# Patient Record
Sex: Female | Born: 1965 | Race: Black or African American | Hispanic: No | Smoking: Light tobacco smoker
Health system: Southern US, Community
[De-identification: ages and names within clinical notes are randomized; demographics above are authoritative.]

## PROBLEM LIST (undated history)

## (undated) DIAGNOSIS — E1140 Type 2 diabetes mellitus with diabetic neuropathy, unspecified: Secondary | ICD-10-CM

## (undated) DIAGNOSIS — R0600 Dyspnea, unspecified: Secondary | ICD-10-CM

## (undated) DIAGNOSIS — R011 Cardiac murmur, unspecified: Secondary | ICD-10-CM

## (undated) DIAGNOSIS — J449 Chronic obstructive pulmonary disease, unspecified: Secondary | ICD-10-CM

## (undated) DIAGNOSIS — E785 Hyperlipidemia, unspecified: Secondary | ICD-10-CM

## (undated) DIAGNOSIS — G4733 Obstructive sleep apnea (adult) (pediatric): Secondary | ICD-10-CM

## (undated) DIAGNOSIS — L03032 Cellulitis of left toe: Secondary | ICD-10-CM

## (undated) DIAGNOSIS — F419 Anxiety disorder, unspecified: Secondary | ICD-10-CM

## (undated) DIAGNOSIS — L2081 Atopic neurodermatitis: Secondary | ICD-10-CM

## (undated) DIAGNOSIS — I7410 Embolism and thrombosis of unspecified parts of aorta: Secondary | ICD-10-CM

## (undated) DIAGNOSIS — K3189 Other diseases of stomach and duodenum: Secondary | ICD-10-CM

## (undated) DIAGNOSIS — Z720 Tobacco use: Secondary | ICD-10-CM

## (undated) DIAGNOSIS — T8859XA Other complications of anesthesia, initial encounter: Secondary | ICD-10-CM

## (undated) DIAGNOSIS — L720 Epidermal cyst: Secondary | ICD-10-CM

## (undated) DIAGNOSIS — K219 Gastro-esophageal reflux disease without esophagitis: Secondary | ICD-10-CM

## (undated) DIAGNOSIS — I517 Cardiomegaly: Secondary | ICD-10-CM

## (undated) DIAGNOSIS — K259 Gastric ulcer, unspecified as acute or chronic, without hemorrhage or perforation: Secondary | ICD-10-CM

## (undated) DIAGNOSIS — E119 Type 2 diabetes mellitus without complications: Secondary | ICD-10-CM

## (undated) DIAGNOSIS — I10 Essential (primary) hypertension: Secondary | ICD-10-CM

## (undated) DIAGNOSIS — L709 Acne, unspecified: Secondary | ICD-10-CM

## (undated) DIAGNOSIS — I429 Cardiomyopathy, unspecified: Secondary | ICD-10-CM

## (undated) DIAGNOSIS — D649 Anemia, unspecified: Secondary | ICD-10-CM

## (undated) DIAGNOSIS — E669 Obesity, unspecified: Secondary | ICD-10-CM

## (undated) DIAGNOSIS — F341 Dysthymic disorder: Secondary | ICD-10-CM

## (undated) DIAGNOSIS — T4145XA Adverse effect of unspecified anesthetic, initial encounter: Secondary | ICD-10-CM

## (undated) HISTORY — PX: SHOULDER SURGERY: SHX246

## (undated) HISTORY — DX: Anemia, unspecified: D64.9

## (undated) HISTORY — DX: Hyperlipidemia, unspecified: E78.5

## (undated) HISTORY — DX: Tobacco use: Z72.0

## (undated) HISTORY — DX: Gastric ulcer, unspecified as acute or chronic, without hemorrhage or perforation: K25.9

## (undated) HISTORY — DX: Dysthymic disorder: F34.1

## (undated) HISTORY — DX: Chronic obstructive pulmonary disease, unspecified: J44.9

## (undated) HISTORY — DX: Obstructive sleep apnea (adult) (pediatric): G47.33

## (undated) HISTORY — PX: TUBAL LIGATION: SHX77

## (undated) HISTORY — DX: Other diseases of stomach and duodenum: K31.89

## (undated) HISTORY — DX: Atopic neurodermatitis: L20.81

## (undated) HISTORY — DX: Cardiomyopathy, unspecified: I42.9

## (undated) HISTORY — DX: Type 2 diabetes mellitus without complications: E11.9

## (undated) HISTORY — DX: Essential (primary) hypertension: I10

## (undated) HISTORY — DX: Cellulitis of left toe: L03.032

## (undated) HISTORY — PX: NOVASURE ABLATION: SHX5394

## (undated) HISTORY — PX: OTHER SURGICAL HISTORY: SHX169

## (undated) HISTORY — DX: Epidermal cyst: L72.0

## (undated) HISTORY — DX: Cardiomegaly: I51.7

## (undated) HISTORY — DX: Anxiety disorder, unspecified: F41.9

## (undated) HISTORY — DX: Gastro-esophageal reflux disease without esophagitis: K21.9

## (undated) HISTORY — DX: Embolism and thrombosis of unspecified parts of aorta: I74.10

## (undated) HISTORY — DX: Obesity, unspecified: E66.9

## (undated) HISTORY — DX: Acne, unspecified: L70.9

---

## 2015-11-08 DIAGNOSIS — G4733 Obstructive sleep apnea (adult) (pediatric): Secondary | ICD-10-CM | POA: Diagnosis not present

## 2015-12-02 DIAGNOSIS — Z6837 Body mass index (BMI) 37.0-37.9, adult: Secondary | ICD-10-CM | POA: Diagnosis not present

## 2015-12-02 DIAGNOSIS — Z4802 Encounter for removal of sutures: Secondary | ICD-10-CM | POA: Diagnosis not present

## 2015-12-02 DIAGNOSIS — R300 Dysuria: Secondary | ICD-10-CM | POA: Diagnosis not present

## 2015-12-09 DIAGNOSIS — G4733 Obstructive sleep apnea (adult) (pediatric): Secondary | ICD-10-CM | POA: Diagnosis not present

## 2016-01-08 DIAGNOSIS — G4733 Obstructive sleep apnea (adult) (pediatric): Secondary | ICD-10-CM | POA: Diagnosis not present

## 2016-02-08 DIAGNOSIS — G4733 Obstructive sleep apnea (adult) (pediatric): Secondary | ICD-10-CM | POA: Diagnosis not present

## 2016-03-05 DIAGNOSIS — F341 Dysthymic disorder: Secondary | ICD-10-CM | POA: Diagnosis not present

## 2016-03-05 DIAGNOSIS — E119 Type 2 diabetes mellitus without complications: Secondary | ICD-10-CM | POA: Diagnosis not present

## 2016-03-05 DIAGNOSIS — K219 Gastro-esophageal reflux disease without esophagitis: Secondary | ICD-10-CM | POA: Diagnosis not present

## 2016-03-05 DIAGNOSIS — G4733 Obstructive sleep apnea (adult) (pediatric): Secondary | ICD-10-CM | POA: Diagnosis not present

## 2016-03-06 DIAGNOSIS — E119 Type 2 diabetes mellitus without complications: Secondary | ICD-10-CM | POA: Diagnosis not present

## 2016-03-06 DIAGNOSIS — Z13220 Encounter for screening for lipoid disorders: Secondary | ICD-10-CM | POA: Diagnosis not present

## 2016-03-06 DIAGNOSIS — I10 Essential (primary) hypertension: Secondary | ICD-10-CM | POA: Diagnosis not present

## 2016-03-06 DIAGNOSIS — R6889 Other general symptoms and signs: Secondary | ICD-10-CM | POA: Diagnosis not present

## 2016-03-06 DIAGNOSIS — R945 Abnormal results of liver function studies: Secondary | ICD-10-CM | POA: Diagnosis not present

## 2016-03-06 DIAGNOSIS — E785 Hyperlipidemia, unspecified: Secondary | ICD-10-CM | POA: Diagnosis not present

## 2016-03-09 DIAGNOSIS — G4733 Obstructive sleep apnea (adult) (pediatric): Secondary | ICD-10-CM | POA: Diagnosis not present

## 2016-03-31 ENCOUNTER — Ambulatory Visit: Payer: BLUE CROSS/BLUE SHIELD | Admitting: Cardiology

## 2016-04-08 ENCOUNTER — Encounter: Payer: Self-pay | Admitting: Cardiology

## 2016-04-09 DIAGNOSIS — G4733 Obstructive sleep apnea (adult) (pediatric): Secondary | ICD-10-CM | POA: Diagnosis not present

## 2016-04-16 DIAGNOSIS — M25521 Pain in right elbow: Secondary | ICD-10-CM | POA: Diagnosis not present

## 2016-04-16 DIAGNOSIS — T148 Other injury of unspecified body region: Secondary | ICD-10-CM | POA: Diagnosis not present

## 2016-04-16 DIAGNOSIS — R51 Headache: Secondary | ICD-10-CM | POA: Diagnosis not present

## 2016-04-16 DIAGNOSIS — M79601 Pain in right arm: Secondary | ICD-10-CM | POA: Diagnosis not present

## 2016-04-16 DIAGNOSIS — R0789 Other chest pain: Secondary | ICD-10-CM | POA: Diagnosis not present

## 2016-04-16 DIAGNOSIS — M542 Cervicalgia: Secondary | ICD-10-CM | POA: Diagnosis not present

## 2016-04-22 DIAGNOSIS — S134XXA Sprain of ligaments of cervical spine, initial encounter: Secondary | ICD-10-CM | POA: Diagnosis not present

## 2016-04-22 DIAGNOSIS — M542 Cervicalgia: Secondary | ICD-10-CM | POA: Diagnosis not present

## 2016-04-22 DIAGNOSIS — S5001XA Contusion of right elbow, initial encounter: Secondary | ICD-10-CM | POA: Diagnosis not present

## 2016-04-23 ENCOUNTER — Encounter (INDEPENDENT_AMBULATORY_CARE_PROVIDER_SITE_OTHER): Payer: Self-pay

## 2016-04-23 ENCOUNTER — Encounter: Payer: Self-pay | Admitting: Cardiology

## 2016-04-23 ENCOUNTER — Ambulatory Visit (INDEPENDENT_AMBULATORY_CARE_PROVIDER_SITE_OTHER): Payer: BLUE CROSS/BLUE SHIELD | Admitting: Cardiology

## 2016-04-23 VITALS — BP 192/102 | HR 95 | Ht 65.5 in | Wt 228.0 lb

## 2016-04-23 DIAGNOSIS — I10 Essential (primary) hypertension: Secondary | ICD-10-CM | POA: Diagnosis not present

## 2016-04-23 DIAGNOSIS — I429 Cardiomyopathy, unspecified: Secondary | ICD-10-CM

## 2016-04-23 DIAGNOSIS — Z720 Tobacco use: Secondary | ICD-10-CM | POA: Diagnosis not present

## 2016-04-23 DIAGNOSIS — I110 Hypertensive heart disease with heart failure: Secondary | ICD-10-CM | POA: Insufficient documentation

## 2016-04-23 DIAGNOSIS — E669 Obesity, unspecified: Secondary | ICD-10-CM | POA: Diagnosis not present

## 2016-04-23 HISTORY — DX: Obesity, unspecified: E66.9

## 2016-04-23 HISTORY — DX: Cardiomyopathy, unspecified: I42.9

## 2016-04-23 HISTORY — DX: Tobacco use: Z72.0

## 2016-04-23 HISTORY — DX: Essential (primary) hypertension: I10
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
## 2016-04-23 NOTE — Patient Instructions (Signed)
Medication Instructions:  Please take medications as listed.  Testing/Procedures: Your physician has requested that you have an echocardiogram. Echocardiography is a painless test that uses sound waves to create images of your heart. It provides your doctor with information about the size and shape of your heart and how well your heart's chambers and valves are working. This procedure takes approximately one hour. There are no restrictions for this procedure.  Follow-Up: Follow up will be based on the results of the above testing.  Thank you for choosing [facility]!!   '

## 2016-05-07 ENCOUNTER — Ambulatory Visit (HOSPITAL_COMMUNITY): Payer: BLUE CROSS/BLUE SHIELD | Attending: Cardiology

## 2016-05-07 ENCOUNTER — Other Ambulatory Visit: Payer: Self-pay

## 2016-05-07 DIAGNOSIS — E785 Hyperlipidemia, unspecified: Secondary | ICD-10-CM | POA: Diagnosis not present

## 2016-05-07 DIAGNOSIS — I429 Cardiomyopathy, unspecified: Secondary | ICD-10-CM

## 2016-05-07 DIAGNOSIS — I351 Nonrheumatic aortic (valve) insufficiency: Secondary | ICD-10-CM | POA: Insufficient documentation

## 2016-05-07 DIAGNOSIS — E119 Type 2 diabetes mellitus without complications: Secondary | ICD-10-CM | POA: Diagnosis not present

## 2016-05-07 DIAGNOSIS — G4733 Obstructive sleep apnea (adult) (pediatric): Secondary | ICD-10-CM | POA: Insufficient documentation

## 2016-05-07 DIAGNOSIS — J449 Chronic obstructive pulmonary disease, unspecified: Secondary | ICD-10-CM | POA: Diagnosis not present

## 2016-05-07 DIAGNOSIS — I10 Essential (primary) hypertension: Secondary | ICD-10-CM

## 2016-05-07 DIAGNOSIS — I119 Hypertensive heart disease without heart failure: Secondary | ICD-10-CM | POA: Insufficient documentation

## 2016-05-07 DIAGNOSIS — I340 Nonrheumatic mitral (valve) insufficiency: Secondary | ICD-10-CM | POA: Insufficient documentation

## 2016-05-10 DIAGNOSIS — G4733 Obstructive sleep apnea (adult) (pediatric): Secondary | ICD-10-CM | POA: Diagnosis not present

## 2016-05-14 ENCOUNTER — Encounter: Payer: Self-pay | Admitting: *Deleted

## 2016-07-13 ENCOUNTER — Telehealth: Payer: Self-pay | Admitting: Cardiology

## 2016-07-13 NOTE — Telephone Encounter (Signed)
Reviewed the below information with pt who states understanding.  She will f/u with her PCP.  Notes Recorded by Jake BatheMark C Skains, MD on 05/11/2016 at 6:17 AM EDT EF normal 60%.  Mild to moderate aortic regurgitation.  Moderate LVH  Continue with BP control.  No signs of cardiomyopathy.  Reassuring  With mild to moderate aortic regurgitation, consider repeat ECHO in 3 -5 years.  No need at this point for cardiology follow up. Continue care under Dr. Marina GoodellPerry (PCP)  Donato SchultzMark Skains, MD

## 2016-07-13 NOTE — Telephone Encounter (Signed)
New message    Pt verbalized that she is calling for Echo results

## 2016-07-17 DIAGNOSIS — I10 Essential (primary) hypertension: Secondary | ICD-10-CM | POA: Diagnosis not present

## 2016-07-17 DIAGNOSIS — F341 Dysthymic disorder: Secondary | ICD-10-CM | POA: Diagnosis not present

## 2016-07-17 DIAGNOSIS — G4733 Obstructive sleep apnea (adult) (pediatric): Secondary | ICD-10-CM | POA: Diagnosis not present

## 2016-07-17 DIAGNOSIS — E119 Type 2 diabetes mellitus without complications: Secondary | ICD-10-CM | POA: Diagnosis not present

## 2016-07-21 DIAGNOSIS — M7989 Other specified soft tissue disorders: Secondary | ICD-10-CM | POA: Diagnosis not present

## 2016-07-22 DIAGNOSIS — M7061 Trochanteric bursitis, right hip: Secondary | ICD-10-CM | POA: Diagnosis not present

## 2016-11-03 DIAGNOSIS — I10 Essential (primary) hypertension: Secondary | ICD-10-CM | POA: Diagnosis not present

## 2016-11-03 DIAGNOSIS — E1142 Type 2 diabetes mellitus with diabetic polyneuropathy: Secondary | ICD-10-CM | POA: Diagnosis not present

## 2016-11-03 DIAGNOSIS — E782 Mixed hyperlipidemia: Secondary | ICD-10-CM | POA: Diagnosis not present

## 2016-11-05 DIAGNOSIS — I10 Essential (primary) hypertension: Secondary | ICD-10-CM | POA: Diagnosis not present

## 2016-11-05 DIAGNOSIS — E782 Mixed hyperlipidemia: Secondary | ICD-10-CM | POA: Diagnosis not present

## 2016-11-05 DIAGNOSIS — E1142 Type 2 diabetes mellitus with diabetic polyneuropathy: Secondary | ICD-10-CM | POA: Diagnosis not present

## 2016-11-06 DIAGNOSIS — I70213 Atherosclerosis of native arteries of extremities with intermittent claudication, bilateral legs: Secondary | ICD-10-CM | POA: Diagnosis not present

## 2016-11-06 DIAGNOSIS — M79605 Pain in left leg: Secondary | ICD-10-CM | POA: Diagnosis not present

## 2016-11-06 DIAGNOSIS — M79604 Pain in right leg: Secondary | ICD-10-CM | POA: Diagnosis not present

## 2016-11-24 ENCOUNTER — Other Ambulatory Visit: Payer: Self-pay | Admitting: *Deleted

## 2016-11-24 DIAGNOSIS — I739 Peripheral vascular disease, unspecified: Secondary | ICD-10-CM

## 2016-12-10 ENCOUNTER — Encounter: Payer: Self-pay | Admitting: Vascular Surgery

## 2016-12-21 DIAGNOSIS — Z6837 Body mass index (BMI) 37.0-37.9, adult: Secondary | ICD-10-CM | POA: Diagnosis not present

## 2016-12-21 DIAGNOSIS — R875 Abnormal microbiological findings in specimens from female genital organs: Secondary | ICD-10-CM | POA: Diagnosis not present

## 2016-12-21 DIAGNOSIS — Z0000 Encounter for general adult medical examination without abnormal findings: Secondary | ICD-10-CM | POA: Diagnosis not present

## 2016-12-21 DIAGNOSIS — Z1231 Encounter for screening mammogram for malignant neoplasm of breast: Secondary | ICD-10-CM | POA: Diagnosis not present

## 2016-12-21 DIAGNOSIS — Z01419 Encounter for gynecological examination (general) (routine) without abnormal findings: Secondary | ICD-10-CM | POA: Diagnosis not present

## 2016-12-22 ENCOUNTER — Encounter: Payer: Self-pay | Admitting: Vascular Surgery

## 2016-12-22 ENCOUNTER — Ambulatory Visit (HOSPITAL_COMMUNITY)
Admission: RE | Admit: 2016-12-22 | Discharge: 2016-12-22 | Disposition: A | Discharge status: Home / Self Care | Payer: BLUE CROSS/BLUE SHIELD | Source: Ambulatory Visit | Attending: Vascular Surgery | Admitting: Vascular Surgery

## 2016-12-22 ENCOUNTER — Telehealth: Payer: Self-pay | Admitting: Vascular Surgery

## 2016-12-22 ENCOUNTER — Ambulatory Visit (INDEPENDENT_AMBULATORY_CARE_PROVIDER_SITE_OTHER): Payer: BLUE CROSS/BLUE SHIELD | Admitting: Vascular Surgery

## 2016-12-22 VITALS — BP 142/78 | HR 76 | Temp 97.7°F | Resp 16 | Ht 65.5 in | Wt 227.0 lb

## 2016-12-22 DIAGNOSIS — I739 Peripheral vascular disease, unspecified: Secondary | ICD-10-CM | POA: Diagnosis not present

## 2016-12-22 NOTE — Telephone Encounter (Signed)
Per Dr.Early's instructions on the discharge sheet from today's visit I scheduled a CTA abd/pelvis w/ runoff at [facility] on [facility] 01/06/17. The patient will need to arrive at 12 noon at the Outpatient Center located on the corner of [facility] for bloodwork prior to the CTA at 1pm.  No solid foods 4 hours prior. Liquids and medications are okay. She is scheduled to see Dr.Early here at [facility] on 01/19/17 at 2:30pm. I left a detailed voice message for the patient regarding these appointments and I also mailed a letter as well. awt

## 2016-12-22 NOTE — Progress Notes (Signed)
[facility]  Patient name: Lori Carter MRN: [id number] DOB: 1966-04-15 Sex: female  REASON FOR CONSULT: Lower extremity arterial insufficiency with limiting claudication  HPI: Lori Carter is a 51 y.o. female, who is seen today for evaluation of lower external ureter insufficiency. She works as a Clinical biochemist and has had progressive changes over the last several months of inability to walk any distance without pain. She reports that this is a 18 cramping sensation that begins in her calves and can extend into her thighs. She reports makes it difficult for her to work having to stop multiple times. She denies any tissue loss with specifically no ulcerations on her feet. She does have a history in the past approximately 10 years ago with a 7 day admission for possible cardiac issues but does never been told specifically that she had a myocardial infarction. He does have a family history of cardiac and peripheral vascular occlusive disease. Does have a long history of cigarette smoking.  Past Medical History:  Diagnosis Date  . Acne   . Anxiety   . Atopic neurodermatitis   . Cardiomegaly   . Cellulitis of toe of left foot    acute  . COPD (chronic obstructive pulmonary disease) (HCC)   . Dysthymic disorder   . Epidermal inclusion cyst   . GERD (gastroesophageal reflux disease)   . Hyperlipidemia   . Hypertension, benign    chronic  . Obstructive sleep apnea   . Type 2 diabetes mellitus without complication (HCC)    chronic    Family History  Problem Relation Age of Onset  . Hypertension Mother   . Clotting disorder Mother   . Heart murmur Father   . Pancreatic cancer Father   . Diabetes Father   . Hypertension Father     SOCIAL HISTORY: Social History   Social History  . Marital status: Divorced    Spouse name: N/A  . Number of children: N/A  . Years of education: N/A   Occupational History  . CNA     Social History Main Topics  . Smoking status: Light Tobacco Smoker  . Smokeless tobacco: Never Used     Comment: 1 pk lasts 4 days.  . Alcohol use No  . Drug use: No  . Sexual activity: Not on file   Other Topics Concern  . Not on file   Social History Narrative  . No narrative on file    No Known Allergies  Current Outpatient Prescriptions  Medication Sig Dispense Refill  . albuterol (PROVENTIL HFA;VENTOLIN HFA) 108 (90 Base) MCG/ACT inhaler Inhale 2 puffs into the lungs every 6 (six) hours as needed for wheezing or shortness of breath.    Marland Kitchen aspirin 325 MG EC tablet Take 325 mg by mouth daily.    Marland Kitchen atorvastatin (LIPITOR) 10 MG tablet Take 10 mg by mouth daily.    . carvedilol (COREG) 25 MG tablet Take 25 mg by mouth 2 (two) times daily with a meal.    . citalopram (CELEXA) 20 MG tablet Take 20 mg by mouth daily.    . cyclobenzaprine (FLEXERIL) 10 MG tablet Take 5 mg by mouth daily as needed for muscle spasms.    . Emollient (LUBRIDERM) LOTN Apply 1 application topically daily.    Marland Kitchen glucose blood (FREESTYLE LITE) test strip 1 each by Other route as needed for other. Use as instructed    . hydrochlorothiazide (HYDRODIURIL) 25 MG tablet Take 12.5 mg by mouth 2 (  two) times daily.    Marland Kitchen HYDROcodone-acetaminophen (NORCO/VICODIN) 5-325 MG tablet Take 5-325 tablets by mouth every 4 (four) hours as needed for pain.  0  . Lancets (FREESTYLE) lancets 1 each by Other route as needed for other. Use as instructed    . Loratadine (CLARITIN) 10 MG CAPS Take 10 mg by mouth daily as needed (allergies).    . meloxicam (MOBIC) 7.5 MG tablet Take 7.5 mg by mouth daily as needed for pain.  0  . metFORMIN (GLUCOPHAGE) 500 MG tablet Take 500 mg by mouth 2 (two) times daily with a meal.    . Olmesartan-Amlodipine-HCTZ (TRIBENZOR) 40-10-25 MG TABS Take 1 tablet by mouth daily.     No current facility-administered medications for this visit.     REVIEW OF SYSTEMS:   denotes positive finding,   denotes negative finding Cardiac  Comments:  Chest pain or chest pressure: x   Shortness of breath upon exertion: x   Short of breath when lying flat:    Irregular heart rhythm:        Vascular    Pain in calf, thigh, or hip brought on by ambulation: x   Pain in feet at night that wakes you up from your sleep:  x   Blood clot in your veins:    Leg swelling:  x       Pulmonary    Oxygen at home:    Productive cough:  x   Wheezing:         Neurologic    Sudden weakness in arms or legs:  x   Sudden numbness in arms or legs:  x   Sudden onset of difficulty speaking or slurred speech:    Temporary loss of vision in one eye:     Problems with dizziness:         Gastrointestinal    Blood in stool:     Vomited blood:         Genitourinary    Burning when urinating:     Blood in urine:        Psychiatric    Major depression:         Hematologic    Bleeding problems:    Problems with blood clotting too easily:        Skin    Rashes or ulcers:        Constitutional    Fever or chills:      PHYSICAL EXAM: Vitals:   12/22/16 1117  BP: (!) 142/78  Pulse: 76  Resp: 16  Temp: 97.7 F (36.5 C)  TempSrc: Oral  SpO2: 94%  Weight: 227 lb (103 kg)  Height: 5' 5.5" (1.664 m)    GENERAL: The patient is a well-nourished female, in no acute distress. The vital signs are documented above. CARDIOVASCULAR: 2+ radial pulses bilaterally. Absent popliteal and distal pulses bilaterally. Carotid arteries without bruits. PULMONARY: There is good air exchange  ABDOMEN: Soft and non-tender  MUSCULOSKELETAL: There are no major deformities or cyanosis. NEUROLOGIC: No focal weakness or paresthesias are detected. SKIN: There are no ulcers or rashes noted. PSYCHIATRIC: The patient has a normal affect.  DATA:  Noninvasive studies at [facility] suggested ankle arm index of 0.4 bilaterally. 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 No rest pain or tissue loss. 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 Have recommended CT  angiogram for further evaluation. Explained that she may be a candidate for endovascular treatment or may require open bypass surgery depending on the severity of her disease. She will obtain CT angiogram as an outpatient had [facility] and will see me back for further discussion following this.   Larina Earthly, MD FACS [facility] Tel [phone] Pager [phone]

## 2016-12-24 ENCOUNTER — Encounter: Payer: Self-pay | Admitting: Legal Medicine

## 2016-12-24 NOTE — Addendum Note (Signed)
Addended by: Burton Apley A on: 12/24/2016 09:09 AM   Modules accepted: Orders

## 2017-01-06 DIAGNOSIS — I745 Embolism and thrombosis of iliac artery: Secondary | ICD-10-CM | POA: Diagnosis not present

## 2017-01-06 DIAGNOSIS — K760 Fatty (change of) liver, not elsewhere classified: Secondary | ICD-10-CM | POA: Diagnosis not present

## 2017-01-06 DIAGNOSIS — I700 Atherosclerosis of aorta: Secondary | ICD-10-CM | POA: Diagnosis not present

## 2017-01-06 DIAGNOSIS — I739 Peripheral vascular disease, unspecified: Secondary | ICD-10-CM | POA: Diagnosis not present

## 2017-01-12 ENCOUNTER — Encounter: Payer: Self-pay | Admitting: Vascular Surgery

## 2017-01-19 ENCOUNTER — Ambulatory Visit (INDEPENDENT_AMBULATORY_CARE_PROVIDER_SITE_OTHER): Payer: BLUE CROSS/BLUE SHIELD | Admitting: Vascular Surgery

## 2017-01-19 ENCOUNTER — Other Ambulatory Visit: Payer: Self-pay | Admitting: Physician Assistant

## 2017-01-19 ENCOUNTER — Encounter: Payer: Self-pay | Admitting: Vascular Surgery

## 2017-01-19 VITALS — BP 154/83 | HR 85 | Temp 98.9°F | Resp 20 | Ht 65.5 in | Wt 233.0 lb

## 2017-01-19 DIAGNOSIS — I739 Peripheral vascular disease, unspecified: Secondary | ICD-10-CM | POA: Diagnosis not present

## 2017-01-19 MED ORDER — VARENICLINE TARTRATE 0.5 MG X 11 & 1 MG X 42 PO MISC: ORAL | 0 refills | Status: DC

## 2017-01-19 NOTE — Progress Notes (Signed)
Cardiology Office Note   Date:  01/20/2017   ID:  Lori, Carter 30-Oct-1965, MRN [id number]  PCP:  Abigail Miyamoto, MD  Cardiologist:  Dr. Anne Fu     Chief Complaint  Patient presents with  . Pre-op Exam      History of Present Illness: Lori Carter is a 51 y.o. female who presents for pre-op clearance at request of Dr. Arbie Cookey.  She has claudication.  Dr. Arbie Cookey has recommended  aortobifemoral bypass grafting. He explained that she could potentially be a candidate for left femoral endarterectomy in femorofemoral bypass.  She has a hx of cardiomyopathy -echo in 2017 with EF 60%, mild to mod aortic regurg. Moderate LVH. Also hx of HTN, obesity, tobacco use.    Today she is doing well, no chest pain but cannot walk up a flight of steps due to DOE, she has to stop and wait for improved breathing.  When she plays with her grandkids she is SOB.  She has a remote treadmill over 3 years ago but thinks it was OK.   We reviewed her echo from last year.   She occ has lower ext edema but resolves over night.    She takes ASA 2 81 mg tabs, she is not sure why she is on higher dose,  She will check with Dr. Arbie Cookey post procedure to see if 81 mg is enough.    She continues to smoke and has been given chantix pak, the nicoderm patches did not help.    Past Medical History:  Diagnosis Date  . Acne   . Anxiety   . Atopic neurodermatitis   . Cardiomegaly   . Cellulitis of toe of left foot    acute  . COPD (chronic obstructive pulmonary disease) (HCC)   . Dysthymic disorder   . Epidermal inclusion cyst   . GERD (gastroesophageal reflux disease)   . Hyperlipidemia   . Hypertension, benign    chronic  . Obstructive sleep apnea   . Type 2 diabetes mellitus without complication (HCC)    chronic    Past Surgical History:  Procedure Laterality Date  . none       Current Outpatient Prescriptions  Medication Sig Dispense Refill  . albuterol (PROVENTIL HFA;VENTOLIN HFA) 108 (90  Base) MCG/ACT inhaler Inhale 2 puffs into the lungs every 6 (six) hours as needed for wheezing or shortness of breath.    Marland Kitchen aspirin EC 81 MG tablet Take 162 mg by mouth daily.    Marland Kitchen atorvastatin (LIPITOR) 10 MG tablet Take 10 mg by mouth daily.    . carvedilol (COREG) 25 MG tablet Take 25 mg by mouth 2 (two) times daily with a meal.    . citalopram (CELEXA) 20 MG tablet Take 20 mg by mouth daily.    . cyclobenzaprine (FLEXERIL) 10 MG tablet Take 5 mg by mouth daily as needed for muscle spasms.    . Emollient (LUBRIDERM) LOTN Apply 1 application topically daily.    Marland Kitchen gabapentin (NEURONTIN) 300 MG capsule Take 300 mg by mouth 3 (three) times daily.    Marland Kitchen glucose blood (FREESTYLE LITE) test strip 1 each by Other route as needed for other. Use as instructed    . hydrochlorothiazide (HYDRODIURIL) 25 MG tablet Take 12.5 mg by mouth 2 (two) times daily.    Marland Kitchen HYDROcodone-acetaminophen (NORCO/VICODIN) 5-325 MG tablet Take 5-325 tablets by mouth every 4 (four) hours as needed for pain.  0  . Lancets (FREESTYLE) lancets 1 each by  Other route as needed for other. Use as instructed    . Loratadine (CLARITIN) 10 MG CAPS Take 10 mg by mouth daily as needed (allergies).    . meloxicam (MOBIC) 7.5 MG tablet Take 7.5 mg by mouth daily as needed for pain.  0  . metFORMIN (GLUCOPHAGE) 500 MG tablet Take 500 mg by mouth 2 (two) times daily with a meal.    . Olmesartan-Amlodipine-HCTZ (TRIBENZOR) 40-10-25 MG TABS Take 1 tablet by mouth daily.    . varenicline (CHANTIX STARTING MONTH PAK) 0.5 MG X 11 & 1 MG X 42 tablet Take one 0.5 mg tablet by mouth once daily for 3 days, then increase to one 0.5 mg tablet twice daily for 4 days, then increase to one 1 mg tablet twice daily. 53 tablet 0   No current facility-administered medications for this visit.     Allergies:   Patient has no known allergies.    Social History:  The patient  reports that she has been smoking.  She has never used smokeless tobacco. She reports  that she does not drink alcohol or use drugs.   Family History:  The patient's family history includes Clotting disorder in her mother; Diabetes in her father; Heart murmur in her father; Hypertension in her father and mother; Pancreatic cancer in her father.    ROS:  General:no colds or fevers, no weight changes Skin:no rashes or ulcers HEENT:no blurred vision, no congestion CV:see HPI PUL:see HPI GI:no diarrhea constipation or melena, no indigestion GU:no hematuria, no dysuria MS:no joint pain, + claudication Neuro:no syncope, no lightheadedness Endo:+ diabetes with glucose 120-130.  no thyroid disease  Wt Readings from Last 3 Encounters:  01/20/17 231 lb (104.8 kg)  01/19/17 233 lb (105.7 kg)  12/22/16 227 lb (103 kg)     PHYSICAL EXAM: VS:  BP 132/74   Pulse 83   Ht 5' 5.5" (1.664 m)   Wt 231 lb (104.8 kg)   LMP  (LMP Unknown)   BMI 37.86 kg/m  , BMI Body mass index is 37.86 kg/m. General:Pleasant affect, NAD Skin:Warm and dry, brisk capillary refill HEENT:normocephalic, sclera clear, mucus membranes moist Neck:supple, no JVD, no bruits  Heart:S1S2 RRR with 2/6 systolic murmur, no gallup, rub or click Lungs:clear without rales, rhonchi, or wheezes ZOX:WRUEAbd:soft, non tender, + BS, do not palpate liver spleen or masses Ext:tr lower ext edema, 2+ radial pulses Neuro:alert and oriented X 3, MAE, follows commands, + facial symmetry    EKG:  EKG is ordered today. The ekg ordered today demonstrates SR, T wave abnormality in inf lat leads similar to EKG 04/23/17.     Recent Labs: No results found for requested labs within last 8760 hours.    Lipid Panel No results found for: CHOL, TRIG, HDL, CHOLHDL, VLDL, LDLCALC, LDLDIRECT     Other studies Reviewed: Additional studies/ records that were reviewed today include:   ECHO 05/2016 . Study Conclusions  - Left ventricle: The cavity size was normal. Wall thickness was   increased in a pattern of moderate LVH. Systolic  function was   normal. The estimated ejection fraction was in the range of 55%   to 60%. Wall motion was normal; there were no regional wall   motion abnormalities. Doppler parameters are consistent with   abnormal left ventricular relaxation (grade 1 diastolic   dysfunction). - Aortic valve: There was no stenosis. There was mild to moderate   regurgitation. - Mitral valve: There was mild regurgitation. - Left atrium: The  atrium was mildly dilated. - Right ventricle: The cavity size was normal. Systolic function   was normal. - Pulmonary arteries: No complete TR doppler jet so unable to   estimate PA systolic pressure. - Inferior vena cava: The vessel was normal in size. The   respirophasic diameter changes were in the normal range (>= 50%),   consistent with normal central venous pressure.  Impressions:  - Normal LV size with moderate LV hypertrophy. EF 55-60%. Mild to   moderate aortic insufficiency. Normal RV size and systolic   function. Mild mitral regurgitation.   ASSESSMENT AND PLAN:  1.  Pre-op exam  Pt cannot walk up one flight of streps without stopping to catch breath, no chest pain  But she is diabetic. Will do exercise myoview to eval for ischemia and to rate for high risk surgery.  If negative will clear ans she will follow up with Dr. Anne Fu in 6 months if + will review with Dr. Anne Fu and possible cath.  2. Hx cardiomyopathy with normal EF on last Echo in 9/2-17  3.  Mild to mod AR and moderate LVH  4. DM-2 per PCP  5. HTN stable today   6. Tobacco use is plannign to stop, she is aware of the importance.    Current medicines are reviewed with the patient today.  The patient Has no concerns regarding medicines.  The following changes have been made:  See above Labs/ tests ordered today include:see above  Disposition:   FU:  see above  Signed, Nada Boozer, NP  01/20/2017 11:01 AM    [facility] [address] [place], [place],  [place]  [address]/ [address] [place], [place] Phone: [phone]; Fax: [phone]  [phone]

## 2017-01-19 NOTE — Progress Notes (Signed)
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 female who was seen for evaluation of lower external ureter insufficiency by Dr. Arbie CookeyEarly on 12/22/16. She works as a Clinical biochemistCMA and has had progressive changes over the last several months of inability to walk any distance without pain. She reports that this is a 18 cramping sensation that begins in her calves and can extend into her thighs. She reports makes it difficult for her to work having to stop multiple times. She denies any tissue loss with specifically no ulcerations on her feet. She does have a history in the past approximately 10 years ago with a 7 day admission for possible cardiac issues but has never been told specifically that she had a myocardial infarction. She does have a family history of cardiac and peripheral vascular occlusive disease. Does have a long history of cigarette smoking.  She had ABI's of 0.4 bilaterally.  Dr. Arbie CookeyEarly recommended CTA and return for results.  She is here today to discuss the results.  She does continue to have claudication with walking short distances. She does not have pain at rest.  She does have some cramping at night on the top of her foot.  She does not have non healing wounds.    She is on a statin for cholesterol management.  She is a beta blocker, ARB,  CCB and HCTZ for blood pressure management. She is on Metformin for her DM.  She takes a daily aspirin.   Past Medical History:  Diagnosis Date  . Acne   . Anxiety   . Atopic neurodermatitis   . Cardiomegaly   . Cellulitis of toe of left foot    acute  . COPD (chronic obstructive pulmonary disease) (HCC)   . Dysthymic disorder   . Epidermal inclusion cyst   . GERD (gastroesophageal reflux disease)   . Hyperlipidemia   . Hypertension, benign    chronic  . Obstructive sleep apnea   . Type 2 diabetes mellitus without complication  (HCC)    chronic    Past Surgical History:  Procedure Laterality Date  . none      No Known Allergies  Current Outpatient Prescriptions  Medication Sig Dispense Refill  . albuterol (PROVENTIL HFA;VENTOLIN HFA) 108 (90 Base) MCG/ACT inhaler Inhale 2 puffs into the lungs every 6 (six) hours as needed for wheezing or shortness of breath.    Marland Kitchen. aspirin 325 MG EC tablet Take 325 mg by mouth daily.    Marland Kitchen. atorvastatin (LIPITOR) 10 MG tablet Take 10 mg by mouth daily.    . carvedilol (COREG) 25 MG tablet Take 25 mg by mouth 2 (two) times daily with a meal.    . citalopram (CELEXA) 20 MG tablet Take 20 mg by mouth daily.    . cyclobenzaprine (FLEXERIL) 10 MG tablet Take 5 mg by mouth daily as needed for muscle spasms.    . Emollient (LUBRIDERM) LOTN Apply 1 application topically daily.    Marland Kitchen. glucose blood (FREESTYLE LITE) test strip 1 each by Other route as needed for other. Use as instructed    . hydrochlorothiazide (HYDRODIURIL) 25 MG tablet Take 12.5 mg by mouth 2 (two) times daily.    Marland Kitchen. HYDROcodone-acetaminophen (NORCO/VICODIN) 5-325 MG tablet Take 5-325 tablets by mouth every 4 (four) hours as needed for pain.  0  . Lancets (FREESTYLE) lancets 1 each by Other route as  needed for other. Use as instructed    . Loratadine (CLARITIN) 10 MG CAPS Take 10 mg by mouth daily as needed (allergies).    . meloxicam (MOBIC) 7.5 MG tablet Take 7.5 mg by mouth daily as needed for pain.  0  . metFORMIN (GLUCOPHAGE) 500 MG tablet Take 500 mg by mouth 2 (two) times daily with a meal.    . Olmesartan-Amlodipine-HCTZ (TRIBENZOR) 40-10-25 MG TABS Take 1 tablet by mouth daily.    . varenicline (CHANTIX STARTING MONTH PAK) 0.5 MG X 11 & 1 MG X 42 tablet Take one 0.5 mg tablet by mouth once daily for 3 days, then increase to one 0.5 mg tablet twice daily for 4 days, then increase to one 1 mg tablet twice daily. 53 tablet 0   No current facility-administered medications for this visit.     Family History  Problem  Relation Age of Onset  . Hypertension Mother   . Clotting disorder Mother   . Heart murmur Father   . Pancreatic cancer Father   . Diabetes Father   . Hypertension Father     Social History   Social History  . Marital status: Divorced    Spouse name: N/A  . Number of children: N/A  . Years of education: N/A   Occupational History  . CNA    Social History Main Topics  . Smoking status: Light Tobacco Smoker  . Smokeless tobacco: Never Used     Comment: 1 pk lasts 4 days.  . Alcohol use No  . Drug use: No  . Sexual activity: Not on file   Other Topics Concern  . Not on file   Social History Narrative  . No narrative on file     REVIEW OF SYSTEMS:   [X]  denotes positive finding, [ ]  denotes negative finding Cardiac  Comments:  Chest pain or chest pressure:    Shortness of breath upon exertion: x   Short of breath when lying flat: x   Irregular heart rhythm: x       Vascular    Pain in calf, thigh, or hip brought on by ambulation: x   Pain in feet at night that wakes you up from your sleep:  x   Blood clot in your veins:    Leg swelling:  x       Pulmonary    Oxygen at home:    Productive cough:  x   Wheezing:         Neurologic    Sudden weakness in arms or legs:  x Legs  Sudden numbness in arms or legs:  x Legs  Sudden onset of difficulty speaking or slurred speech:    Temporary loss of vision in one eye:     Problems with dizziness:         Gastrointestinal    Blood in stool:     Vomited blood:         Genitourinary    Burning when urinating:     Blood in urine:        Psychiatric    Major depression:         Hematologic    Bleeding problems:    Problems with blood clotting too easily:        Skin    Rashes or ulcers:        Constitutional    Fever or chills:      PHYSICAL EXAMINATION:  Vitals:   01/19/17 1417  BP: Marland Kitchen)  154/83  Pulse: 85  Resp: 20  Temp: 98.9 F (37.2 C)   Body mass index is 38.18 kg/m.  General:  WDWN in  NAD; vital signs documented above Gait: Not observed HENT: WNL, normocephalic Pulmonary: normal non-labored breathing Cardiac: regular HR Abdomen: soft, NT, no masses Skin: without rashes Vascular Exam/Pulses:  Right Left  Radial 2+ (normal) 2+ (normal)  Femoral absent absent  Popliteal absent absent  DP absent absent  PT absent absent   Extremities: without ischemic changes, without Gangrene , without cellulitis; without open wounds;  Musculoskeletal: no muscle wasting or atrophy  Neurologic: A&O X 3;  No focal weakness or paresthesias are detected Psychiatric:  The pt has Normal affect.   Non-Invasive Vascular Imaging:   [facility]  CT Angiogram abdomen/pelvis with runoff 01/05/17: Impression Vascular: -Aortic atherosclerosis without aneurysm or occlusive process. 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  Above knee right popliteal artery reconstitutes with preserved 3 vessel runoff distally. -occluded left CFA and SFA.  Reconstituted left profunda artery evident with deep femoral collaterals, which eventually reconstitute the above knee left popliteal artery with preserved 3 vessel runoff.   Impression Non Vascular: -hepatic statosis -colonic diverticulosis  -fibroid uterus -no acute intra abdominal or pelvic finding   Pt meds includes: Statin:  Yes.   Beta Blocker:  Yes.   Aspirin:  Yes.   ACEI:  No. ARB:  Yes.   CCB use:  Yes Other Antiplatelet/Anticoagulant:  No   ASSESSMENT/PLAN:: 51 y.o. 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  -pt continues to have lifestyle limiting claudication.  Her CTA revealed bilateral iliac inflow disease with occluded left CFA and SFA arteries with bilateral 3 vessels runoff distally.    Dr. Arbie CookeyEarly has recommended aortobifemoral bypass grafting.  She will need cardiac clearance so we will get that arranged.   -Dr. Arbie CookeyEarly did discuss with the pt the importance of smoking cessation, blood pressure control and healthy diet to help control the progression of her atherosclerosis.  She is given an Rx for Chantix starter pack.    Doreatha MassedSamantha Keanu Lesniak, PA-C [facility] [phone]  Clinic MD:  Pt seen and examined with Dr. Arbie CookeyEarly I have examined the patient, reviewed and agree with above. Reviewed CT angiogram with the patient. Have recommended aortobifemoral bypass grafting. Explained that she could potentially be a candidate for left femoral endarterectomy in femorofemoral bypass. I feel that her long-term durability of this would be poor compared to aortofemoral bypass grafting. She understands and wished to proceed as soon as possible. Will obtain cardiac clearance prior to surgery.  Gretta BeganEarly, Todd, MD 01/19/2017 3:44 PM

## 2017-01-20 ENCOUNTER — Telehealth (HOSPITAL_COMMUNITY): Payer: Self-pay

## 2017-01-20 ENCOUNTER — Ambulatory Visit (INDEPENDENT_AMBULATORY_CARE_PROVIDER_SITE_OTHER): Payer: BLUE CROSS/BLUE SHIELD | Admitting: Cardiology

## 2017-01-20 ENCOUNTER — Encounter: Payer: Self-pay | Admitting: Cardiology

## 2017-01-20 ENCOUNTER — Encounter (INDEPENDENT_AMBULATORY_CARE_PROVIDER_SITE_OTHER): Payer: Self-pay

## 2017-01-20 VITALS — BP 132/74 | HR 83 | Ht 65.5 in | Wt 231.0 lb

## 2017-01-20 DIAGNOSIS — R0600 Dyspnea, unspecified: Secondary | ICD-10-CM

## 2017-01-20 DIAGNOSIS — Z01818 Encounter for other preprocedural examination: Secondary | ICD-10-CM | POA: Diagnosis not present

## 2017-01-20 DIAGNOSIS — I10 Essential (primary) hypertension: Secondary | ICD-10-CM

## 2017-01-20 DIAGNOSIS — R0609 Other forms of dyspnea: Secondary | ICD-10-CM | POA: Diagnosis not present

## 2017-01-20 DIAGNOSIS — I429 Cardiomyopathy, unspecified: Secondary | ICD-10-CM

## 2017-01-20 DIAGNOSIS — Z720 Tobacco use: Secondary | ICD-10-CM | POA: Diagnosis not present

## 2017-01-20 DIAGNOSIS — I739 Peripheral vascular disease, unspecified: Secondary | ICD-10-CM | POA: Diagnosis not present

## 2017-01-20 NOTE — Telephone Encounter (Signed)
Encounter complete. 

## 2017-01-20 NOTE — Patient Instructions (Signed)
Medication Instructions:  Your physician has recommended you make the following change in your medication: 1.) START Aspirin 81 mg daily. 2.) START Gabapentin is 300 mg three times a day.   Labwork: None Ordered   Testing/Procedures: Your physician has requested that you have a lexiscan myoview. For further information please visit [email or web address]. Please follow instruction sheet, as given.    Follow-Up: Your physician recommends that you schedule a follow-up appointment with Dr. Anne FuSkains after test is finished  Any Other Special Instructions Will Be Listed Below (If Applicable).     If you need a refill on your cardiac medications before your next appointment, please call your pharmacy.  Thank you for choosing [facility]  Corrine CMA AAMA

## 2017-01-21 ENCOUNTER — Ambulatory Visit (HOSPITAL_COMMUNITY)
Admission: RE | Admit: 2017-01-21 | Discharge: 2017-01-21 | Disposition: A | Discharge status: Home / Self Care | Payer: BLUE CROSS/BLUE SHIELD | Source: Ambulatory Visit | Attending: Cardiology | Admitting: Cardiology

## 2017-01-21 ENCOUNTER — Encounter: Payer: Self-pay | Admitting: Vascular Surgery

## 2017-01-21 DIAGNOSIS — I10 Essential (primary) hypertension: Secondary | ICD-10-CM | POA: Diagnosis not present

## 2017-01-21 DIAGNOSIS — E669 Obesity, unspecified: Secondary | ICD-10-CM | POA: Insufficient documentation

## 2017-01-21 DIAGNOSIS — E785 Hyperlipidemia, unspecified: Secondary | ICD-10-CM | POA: Insufficient documentation

## 2017-01-21 DIAGNOSIS — J449 Chronic obstructive pulmonary disease, unspecified: Secondary | ICD-10-CM | POA: Insufficient documentation

## 2017-01-21 DIAGNOSIS — F17200 Nicotine dependence, unspecified, uncomplicated: Secondary | ICD-10-CM | POA: Insufficient documentation

## 2017-01-21 DIAGNOSIS — E119 Type 2 diabetes mellitus without complications: Secondary | ICD-10-CM | POA: Insufficient documentation

## 2017-01-21 DIAGNOSIS — I429 Cardiomyopathy, unspecified: Secondary | ICD-10-CM | POA: Diagnosis not present

## 2017-01-21 DIAGNOSIS — G4733 Obstructive sleep apnea (adult) (pediatric): Secondary | ICD-10-CM | POA: Insufficient documentation

## 2017-01-21 MED ORDER — REGADENOSON 0.4 MG/5ML IV SOLN
0.4000 mg | Freq: Once | INTRAVENOUS | Status: AC
  Administered 2017-01-21: 0.4 mg via INTRAVENOUS

## 2017-01-21 MED ORDER — TECHNETIUM TC 99M TETROFOSMIN IV KIT
31.7000 | PACK | Freq: Once | INTRAVENOUS | Status: AC | PRN
  Administered 2017-01-21: 31.7 via INTRAVENOUS
  Filled 2017-01-21: qty 32

## 2017-01-22 ENCOUNTER — Ambulatory Visit (HOSPITAL_COMMUNITY)
Admission: RE | Admit: 2017-01-22 | Discharge: 2017-01-22 | Disposition: A | Discharge status: Home / Self Care | Payer: BLUE CROSS/BLUE SHIELD | Source: Ambulatory Visit | Attending: Cardiovascular Disease | Admitting: Cardiovascular Disease

## 2017-01-22 LAB — MYOCARDIAL PERFUSION IMAGING
CHL CUP NUCLEAR SDS: 4
CHL CUP NUCLEAR SRS: 1
CHL CUP NUCLEAR SSS: 5
LV dias vol: 124 mL (ref 46–106)
LV sys vol: 58 mL
Peak HR: 112 {beats}/min
Rest HR: 82 {beats}/min
TID: 0.8

## 2017-01-22 MED ORDER — TECHNETIUM TC 99M TETROFOSMIN IV KIT
28.8000 | PACK | Freq: Once | INTRAVENOUS | Status: AC | PRN
  Administered 2017-01-22: 28.8 via INTRAVENOUS

## 2017-01-26 ENCOUNTER — Telehealth: Payer: Self-pay | Admitting: Cardiology

## 2017-01-26 NOTE — Telephone Encounter (Signed)
-----   Message from Leone BrandLaura R Ingold, NP sent at 01/24/2017 12:33 PM EDT ----- See note per Dr. Anne FuSkains and ok for surgery low risk.  Thanks.  ----- Message ----- From: Jake BatheSkains, Mark C, MD Sent: 01/24/2017  10:52 AM To: Leone BrandLaura R Ingold, NP, Elliot CousinJennifer Aowyn Rozeboom, RMA  Reassuring NUC stress. EF OK.  No need for ECHO. Low risk, OK to proceed with surgery Donato SchultzMark Skains, MD  ----- Message ----- From: Leone BrandIngold, Laura R, NP Sent: 01/22/2017   4:36 PM To: Jake BatheMark C Skains, MD, Elliot CousinJennifer Avrey Flanagin, RMA  Dr. Anne FuSkains, can you review?  Pt needing pre-op clearance  She has DOE, I can do echo as EF last year was 60%.  Let me know if anything else.  Thanks.

## 2017-01-26 NOTE — Telephone Encounter (Signed)
Returned pts call and discussed her stress test results. She was advised to contact the office that is doing her sx and have them send us a surgical clearance form. Pt verbalized understanding.

## 2017-01-26 NOTE — Telephone Encounter (Signed)
New message ° ° ° °Pt is calling back about results.  °

## 2017-01-29 ENCOUNTER — Other Ambulatory Visit: Payer: Self-pay

## 2017-02-02 ENCOUNTER — Encounter (HOSPITAL_COMMUNITY): Payer: Self-pay

## 2017-02-02 ENCOUNTER — Encounter (HOSPITAL_COMMUNITY)
Admission: RE | Admit: 2017-02-02 | Discharge: 2017-02-02 | Disposition: A | Discharge status: Home / Self Care | Payer: BLUE CROSS/BLUE SHIELD | Source: Ambulatory Visit | Attending: Vascular Surgery | Admitting: Vascular Surgery

## 2017-02-02 DIAGNOSIS — I10 Essential (primary) hypertension: Secondary | ICD-10-CM | POA: Diagnosis not present

## 2017-02-02 DIAGNOSIS — Z79899 Other long term (current) drug therapy: Secondary | ICD-10-CM | POA: Diagnosis not present

## 2017-02-02 DIAGNOSIS — Z01812 Encounter for preprocedural laboratory examination: Secondary | ICD-10-CM | POA: Insufficient documentation

## 2017-02-02 DIAGNOSIS — Z7984 Long term (current) use of oral hypoglycemic drugs: Secondary | ICD-10-CM | POA: Insufficient documentation

## 2017-02-02 DIAGNOSIS — E785 Hyperlipidemia, unspecified: Secondary | ICD-10-CM | POA: Diagnosis not present

## 2017-02-02 DIAGNOSIS — J449 Chronic obstructive pulmonary disease, unspecified: Secondary | ICD-10-CM | POA: Diagnosis not present

## 2017-02-02 DIAGNOSIS — E119 Type 2 diabetes mellitus without complications: Secondary | ICD-10-CM | POA: Insufficient documentation

## 2017-02-02 DIAGNOSIS — Z8679 Personal history of other diseases of the circulatory system: Secondary | ICD-10-CM | POA: Diagnosis not present

## 2017-02-02 DIAGNOSIS — G4733 Obstructive sleep apnea (adult) (pediatric): Secondary | ICD-10-CM | POA: Diagnosis not present

## 2017-02-02 DIAGNOSIS — F17210 Nicotine dependence, cigarettes, uncomplicated: Secondary | ICD-10-CM | POA: Diagnosis not present

## 2017-02-02 DIAGNOSIS — F419 Anxiety disorder, unspecified: Secondary | ICD-10-CM | POA: Diagnosis not present

## 2017-02-02 HISTORY — DX: Dyspnea, unspecified: R06.00

## 2017-02-02 HISTORY — DX: Type 2 diabetes mellitus with diabetic neuropathy, unspecified: E11.40

## 2017-02-02 HISTORY — DX: Adverse effect of unspecified anesthetic, initial encounter: T41.45XA

## 2017-02-02 HISTORY — DX: Other complications of anesthesia, initial encounter: T88.59XA

## 2017-02-02 HISTORY — DX: Cardiac murmur, unspecified: R01.1

## 2017-02-02 LAB — CBC
HEMATOCRIT: 38.4 % (ref 36.0–46.0)
HEMOGLOBIN: 12.4 g/dL (ref 12.0–15.0)
MCH: 28.6 pg (ref 26.0–34.0)
MCHC: 32.3 g/dL (ref 30.0–36.0)
MCV: 88.5 fL (ref 78.0–100.0)
Platelets: 208 10*3/uL (ref 150–400)
RBC: 4.34 MIL/uL (ref 3.87–5.11)
RDW: 17.1 % — ABNORMAL HIGH (ref 11.5–15.5)
WBC: 5.2 10*3/uL (ref 4.0–10.5)

## 2017-02-02 LAB — URINALYSIS, ROUTINE W REFLEX MICROSCOPIC
Bacteria, UA: NONE SEEN
Bilirubin Urine: NEGATIVE
GLUCOSE, UA: NEGATIVE mg/dL
HGB URINE DIPSTICK: NEGATIVE
KETONES UR: NEGATIVE mg/dL
LEUKOCYTES UA: NEGATIVE
NITRITE: NEGATIVE
PROTEIN: 30 mg/dL — AB
Specific Gravity, Urine: 1.026 (ref 1.005–1.030)
pH: 6 (ref 5.0–8.0)

## 2017-02-02 LAB — COMPREHENSIVE METABOLIC PANEL
ALBUMIN: 3.5 g/dL (ref 3.5–5.0)
ALK PHOS: 98 U/L (ref 38–126)
ALT: 16 U/L (ref 14–54)
AST: 17 U/L (ref 15–41)
Anion gap: 10 (ref 5–15)
BILIRUBIN TOTAL: 0.6 mg/dL (ref 0.3–1.2)
BUN: 14 mg/dL (ref 6–20)
CALCIUM: 9.2 mg/dL (ref 8.9–10.3)
CO2: 26 mmol/L (ref 22–32)
CREATININE: 0.79 mg/dL (ref 0.44–1.00)
Chloride: 104 mmol/L (ref 101–111)
GFR calc non Af Amer: >60 mL/min (ref >60)
GLUCOSE: 109 mg/dL — AB (ref 65–99)
Potassium: 3.7 mmol/L (ref 3.5–5.1)
SODIUM: 140 mmol/L (ref 135–145)
TOTAL PROTEIN: 6.8 g/dL (ref 6.5–8.1)

## 2017-02-02 LAB — ABO/RH: ABO/RH(D): AB NEG

## 2017-02-02 LAB — PROTIME-INR
INR: 1.02
Prothrombin Time: 13.4 seconds (ref 11.4–15.2)

## 2017-02-02 LAB — SURGICAL PCR SCREEN
MRSA, PCR: NEGATIVE
Staphylococcus aureus: NEGATIVE

## 2017-02-02 LAB — GLUCOSE, CAPILLARY: GLUCOSE-CAPILLARY: 115 mg/dL — AB (ref 65–99)

## 2017-02-02 LAB — PREGNANCY, URINE: PREG TEST UR: NEGATIVE

## 2017-02-02 LAB — TYPE AND SCREEN
ABO/RH(D): AB NEG
Antibody Screen: NEGATIVE

## 2017-02-02 LAB — APTT: aPTT: 32 seconds (ref 24–36)

## 2017-02-02 NOTE — Pre-Procedure Instructions (Signed)
Lori Carter  02/02/2017     Your procedure is scheduled on Monday, June 11.  Report to [facility] Admitting at 5:30 A.M.              Your surgery or procedure is scheduled for 7:30 AM   Call this number if you have problems the morning of surgery: [phone]               For any other questions, please call [phone], Monday - Friday 8 AM - 4 PM.     Remember:  Do not eat food or drink liquids after midnight Sunday, June 10.  Take these medicines the morning of surgery with A SIP OF WATER : carvedilol (COREG), citalopram (CELEXA), gabapentin (NEURONTIN), atorvastatin (LIPITOR), . May take cyclobenzaprine (FLEXERIL), HYDROcodone-acetaminophen (NORCO/VICODIN), Loratadine (CLARITIN).                    DO NOT Take metFORMIN (GLUCOPHAGE) the morning of surgery.        STOP taking Aspirin , Aspirin Products (Goody Powder, Excedrin Migraine), Ibuprofen (Advil), Naproxen (Aleve),meloxicam (MOBIC) Vitamins and Herbal Products (ie Fish Oil)  How to Manage Your Diabetes Before and After Surgery  WHAT DO I DO ABOUT MY DIABETES MEDICATION? Do not take oral diabetes medicines (pills) the morning of surgery.  Why is it important to control my blood sugar before and after surgery? . Improving blood sugar levels before and after surgery helps healing and can limit problems. . A way of improving blood sugar control is eating a healthy diet by: o  Eating less sugar and carbohydrates o  Increasing activity/exercise o  Talking with your doctor about reaching your blood sugar goals . High blood sugars (greater than 180 mg/dL) can raise your risk of infections and slow your recovery, so you will need to focus on controlling your diabetes during the weeks before surgery. . Make sure that the doctor who takes care of your diabetes knows about your planned surgery including the date and location.  How do I manage my blood sugar before surgery? . Check your blood sugar at least 4  times a day, starting 2 days before surgery, to make sure that the level is not too high or low. o Check your blood sugar the morning of your surgery when you wake up and every 2 hours until you get to the Short Stay unit. . If your blood sugar is less than 70 mg/dL, you will need to treat for low blood sugar: o Do not take insulin. o Treat a low blood sugar (less than 70 mg/dL) with  cup of clear juice (cranberry or apple), 4 glucose tablets, OR glucose gel. o Recheck blood sugar in 15 minutes after treatment (to make sure it is greater than 70 mg/dL). If your blood sugar is not greater than 70 mg/dL on recheck, call [phone] for further instructions. . Report your blood sugar to the short stay nurse when you get to Short Stay.  . If you are admitted to the hospital after surgery: o Your blood sugar will be checked by the staff and you will probably be given insulin after surgery (instead of oral diabetes medicines) to make sure you have good blood sugar levels. o The goal for blood sugar control after surgery is 80-180 mg/dL.   Do not wear jewelry, make-up or nail polish.  Do not wear lotions, powders, or perfumes, or deodorant.  Do not shave 48 hours prior to  surgery.  Men may shave face and neck.  Do not bring valuables to the hospital.  [facility] is not responsible for any belongings or valuables.  Contacts, dentures or bridgework may not be worn into surgery.  Leave your suitcase in the car.  After surgery it may be brought to your room.  For patients admitted to the hospital, discharge time will be determined by your treatment team. Special instructions:  Review  [facility] - Preparing For Surgery.  Please read over the following fact sheets that you were given: [facility]- Pre[facility] For [facility] and Patient Instructions for Mupirocin Application, Coughing and Deep Breathing, Surgical Site Infections    Patient Signature:  Date:   Nurse Signature:  Date:

## 2017-02-02 NOTE — Progress Notes (Signed)
  [facility]- Preparing For Surgery  Before surgery, you can play an important role. Because skin is not sterile, your skin needs to be as free of germs as possible. You can reduce the number of germs on your skin by washing with CHG (chlorahexidine gluconate) Soap before surgery.  CHG is an antiseptic cleaner which kills germs and bonds with the skin to continue killing germs even after washing.  Please do not use if you have an allergy to CHG or antibacterial soaps. If your skin becomes reddened/irritated stop using the CHG.  Do not shave (including legs and underarms) for at least 48 hours prior to first CHG shower. It is OK to shave your face.  Please follow these instructions carefully.   1. Shower the NIGHT BEFORE SURGERY and the MORNING OF SURGERY with CHG.   2. If you chose to wash your hair, wash your hair first as usual with your normal shampoo.  3. After you shampoo, rinse your hair and body thoroughly to remove the shampoo.  Wash your face and private area with your normal soap, then rinse  4. Use CHG as you would any other liquid soap. You can apply CHG directly to the skin and wash gently with a scrungie or a clean washcloth.   5. Apply the CHG Soap to your body ONLY FROM THE NECK DOWN.  Do not use on open wounds or open sores. Avoid contact with your eyes, ears, mouth and genitals (private parts). Wash genitals (private parts) with your normal soap.  6. Wash thoroughly, paying special attention to the area where your surgery will be performed.  7. Thoroughly rinse your body with warm water from the neck down.  8. DO NOT shower/wash with your normal soap after using and rinsing off the CHG Soap.  9. Pat yourself dry with a CLEAN TOWEL.   10. Wear CLEAN PAJAMAS   11. Place CLEAN SHEETS on your bed the night of your first shower and DO NOT SLEEP WITH PETS.  Day of Surgery: Do not apply any deodorants/lotions. Please wear clean clothes to the hospital/surgery center.      

## 2017-02-02 NOTE — Pre-Procedure Instructions (Signed)
Lori Carter  02/02/2017     Your procedure is scheduled on Monday, June 11.  [facility] at 5:30 A.M.              Your surgery or procedure is scheduled for 7:30 AM   Call this number if you have problems the morning of surgery: [phone]               For any other questions, please call [phone], Monday - Friday 8 AM - 4 PM.     Remember:  Do not eat food or drink liquids after midnight Sunday, June 10.  Take these medicines the morning of surgery with A SIP OF WATER : carvedilol (COREG), citalopram (CELEXA), gabapentin (NEURONTIN), atorvastatin (LIPITOR), . May take cyclobenzaprine (FLEXERIL), HYDROcodone-acetaminophen (NORCO/VICODIN), Loratadine (CLARITIN).                    DO NOT Take metFORMIN (GLUCOPHAGE) the morning of surgery.        STOP taking Aspirin , Aspirin Products (Goody Powder, Excedrin Migraine), Ibuprofen (Advil), Naproxen (Aleve),meloxicam (MOBIC) Vitamins and Herbal Products (ie Fish Oil)  How to Manage Your Diabetes Before and After Surgery  WHAT DO I DO ABOUT MY DIABETES MEDICATION? Do not take oral diabetes medicines (pills) the morning of surgery.  Why is it important to control my blood sugar before and after surgery? . Improving blood sugar levels before and after surgery helps healing and can limit problems. . A way of improving blood sugar control is eating a healthy diet by: o  Eating less sugar and carbohydrates o  Increasing activity/exercise o  Talking with your doctor about reaching your blood sugar goals . High blood sugars (greater than 180 mg/dL) can raise your risk of infections and slow your recovery, so you will need to focus on controlling your diabetes during the weeks before surgery. . Make sure that the doctor who takes care of your diabetes knows about your planned surgery including the date and location.  How do I manage my blood sugar before surgery? . Check your blood sugar at least 4  times a day, starting 2 days before surgery, to make sure that the level is not too high or low. o Check your blood sugar the morning of your surgery when you wake up and every 2 hours until you get to the Short Stay unit. . If your blood sugar is less than 70 mg/dL, you will need to treat for low blood sugar: o Do not take insulin. o Treat a low blood sugar (less than 70 mg/dL) with  cup of clear juice (cranberry or apple), 4 glucose tablets, OR glucose gel. o Recheck blood sugar in 15 minutes after treatment (to make sure it is greater than 70 mg/dL). If your blood sugar is not greater than 70 mg/dL on recheck, call [phone] for further instructions. . Report your blood sugar to the short stay nurse when you get to Short Stay.  . If you are admitted to the hospital after surgery: o Your blood sugar will be checked by the staff and you will probably be given insulin after surgery (instead of oral diabetes medicines) to make sure you have good blood sugar levels. o The goal for blood sugar control after surgery is 80-180 mg/dL.      Patient Signature:  Date:   Nurse Signature:  Date:      Do not wear jewelry, make-up or nail polish.  Do not wear lotions, powders, or perfumes, or deoderant.  Do not shave 48 hours prior to surgery.  Men may shave face and neck.  Do not bring valuables to the hospital.  [facility] is not responsible for any belongings or valuables.  Contacts, dentures or bridgework may not be worn into surgery.  Leave your suitcase in the car.  After surgery it may be brought to your room.  For patients admitted to the hospital, discharge time will be determined by your treatment team.  Patients discharged the day of surgery will not be allowed to drive home.    Special instructions:  Review  [facility] - Preparing For Surgery.  Please read over the following fact sheets that you were given: [facility]- Preparing For Surgery and Patient Instructions for  Mupirocin Application, Coughing and Deep Breathing, Surgical Site Infections     Patient Signature:  Date:   Nurse Signature:  Date:

## 2017-02-02 NOTE — Pre-Procedure Instructions (Addendum)
Lori Carter  02/02/2017     Your procedure is scheduled on Monday, June 11.  [facility]30 [facility].M[facility]              Your surgery or procedure is scheduled for 7:30 AM   Call this number if you have problems the morning of surgery: [phone]               For any other questions, please call [phone], Monday - Friday 8 AM - 4 PM.     Remember:  Do not eat food or drink liquids after midnight Sunday, June 10.  Take these medicines the morning of surgery with A SIP OF WATER : carvedilol (COREG),  citalopram (CELEXA),  gabapentin (NEURONTIN),  atorvastatin (LIPITOR) .  May use Albuterol if needed and bring it to the hospital with you.                    DO NOT Take metFORMIN (GLUCOPHAGE) the morning of surgery.        STOP taking Aspirin Products (Goody Powder, Excedrin Migraine), Ibuprofen (Advil), Naproxen (Aleve),meloxicam (MOBIC) Vitamins and Herbal Products (ie Fish Oil)  How to Manage Your Diabetes Before and After Surgery  WHAT DO I DO ABOUT MY DIABETES MEDICATION? Do not take oral diabetes medicines (pills) the morning of surgery.  Why is it important to control my blood sugar before and after surgery? . Improving blood sugar levels before and after surgery helps healing and can limit problems. . A way of improving blood sugar control is eating a healthy diet by: o  Eating less sugar and carbohydrates o  Increasing activity/exercise o  Talking with your doctor about reaching your blood sugar goals . High blood sugars (greater than 180 mg/dL) can raise your risk of infections and slow your recovery, so you will need to focus on controlling your diabetes during the weeks before surgery. . Make sure that the doctor who takes care of your diabetes knows about your planned surgery including the date and location.  How do I manage my blood sugar before surgery? . Check your blood sugar at least 4 times a day, starting 2 days before  surgery, to make sure that the level is not too high or low. o Check your blood sugar the morning of your surgery when you wake up and every 2 hours until you get to the Short Stay unit. . If your blood sugar is less than 70 mg/dL, you will need to treat for low blood sugar: o Do not take insulin. o Treat a low blood sugar (less than 70 mg/dL) with  cup of clear juice (cranberry or apple), 4 glucose tablets, OR glucose gel. o Recheck blood sugar in 15 minutes after treatment (to make sure it is greater than 70 mg/dL). If your blood sugar is not greater than 70 mg/dL on recheck, call [phone] for further instructions. . Report your blood sugar to the short stay nurse when you get to Short Stay.  . If you are admitted to the hospital after surgery: o Your blood sugar will be checked by the staff and you will probably be given insulin after surgery (instead of oral diabetes medicines) to make sure you have good blood sugar levels. o The goal for blood sugar control after surgery is 80-180 mg/dL.   Do not wear jewelry, make-up or nail polish.  Do not wear lotions, powders, or perfumes, or deodorant.  Do not shave 48 hours prior to surgery.  Men may shave face and neck.  Do not bring valuables to the hospital.  [facility] is not responsible for any belongings or valuables.  Contacts, dentures or bridgework may not be worn into surgery.  Leave your suitcase in the car.  After surgery it may be brought to your room.  For patients admitted to the hospital, discharge time will be determined by your treatment team. Special instructions:  Review  [facility] - Preparing For Surgery.  Please read over the following fact sheets that you were given: [facility]- Preparing For Surgery and Patient Instructions for Mupirocin Application, Coughing and Deep Breathing, Surgical Site Infections    Patient Signature:  Date:   Nurse Signature:  Date:

## 2017-02-02 NOTE — Progress Notes (Signed)
Lori Carter denies chest pain or shortness of breath at rest.  PCP is Dr Brent BullaLawrence Perry, patient does not have a cardiologist.  Patient has sleep apnea and has a CPAP, which she wears at times.  Patient reports that CBGs run 100's.  Lori Carter reports that [facility], she does not remember why, but she was given NTG.  I requested records.

## 2017-02-03 NOTE — Progress Notes (Signed)
Anesthesia chart review:  Patient is a 51 year old female scheduled for aorta bifemoral bypass graft on 02/08/2017 with Gretta Beganodd Early, M.D.  - PCP is Brent BullaLawrence Perry, MD - Cardiologist is Donato SchultzMark Skains, MD.  Last office visit 01/20/17 with Nada BoozerLaura Ingold, NP. Cleared for surgery after stress test, see below.   PMH includes: HTN, DM, hyperlipidemia, cardiomegaly, heart murmur, COPD, OSA, GERD current smoker. BMI 38.  Anesthesia history: Patient reports she woke up during surgery once  BP (!) 171/83 Comment: rechecked  Pulse 87   Temp 36.8 C   Resp 20   Ht 5\' 5"  (1.651 m)   Wt 230 lb (104.3 kg)   LMP  (LMP Unknown)   SpO2 97%   BMI 38.27 kg/m    Medications include: Albuterol, ASA 81 mg, Lipitor, carvedilol, HCTZ, metformin, olmesartan-amlodipine-HCTZ  Preoperative labs 02/02/17 reviewed.   Glucose 109. HbA1c requested from PCP 's office.   EKG 01/20/17: NSR. T wave abnormality, consider inferior ischemia. Prolonged QT.  Nuclear stress test 01/22/17:  The left ventricular ejection fraction is mildly decreased (45-54%).  Nuclear stress EF: 53%.  There was no ST segment deviation noted during stress.  Defect 1: There is a small defect of moderate severity present in the apex location.  This is a low risk study. Probable normal stress nuclear study with apical thinning and no ischemia (trivial reversibility at LV apex felt to be likely not significant); study low risk; EF 53 but appears better; normal wall motion.   Aorta duplex 12/22/16:  - No flow is noted in the R CIA with a large collateral feeding there are external iliac artery suggestive of occlusive disease in the R CIA.  - L distal external iliac artery demonstrates decreased velocities implant waveforms. A brief scan of the L common femoral and proximal superficial femoral arteries demonstrated no color pulsed-wave Doppler signal suggestive of occlusion.  Echo 05/07/16:  - Left ventricle: The cavity size was normal. Wall thickness  was increased in a pattern of moderate LVH. Systolic function was normal. The estimated ejection fraction was in the range of 55% to 60%. Wall motion was normal; there were no regional wall motion abnormalities. Doppler parameters are consistent with abnormal left ventricular relaxation (grade 1 diastolic dysfunction). - Aortic valve: There was no stenosis. There was mild to moderate regurgitation. - Mitral valve: There was mild regurgitation. - Left atrium: The atrium was mildly dilated. - Right ventricle: The cavity size was normal. Systolic function was normal. - Pulmonary arteries: No complete TR doppler jet so unable to estimate PA systolic pressure. - Inferior vena cava: The vessel was normal in size. The respirophasic diameter changes were in the normal range (>= 50%), consistent with normal central venous pressure.  If no changes, I anticipate pt can proceed with surgery as scheduled.   Rica Mastngela Usbaldo Pannone, FNP-BC [facility]Anesthesiology Phone: [phone] 02/03/2017 1:10 PM

## 2017-02-05 DIAGNOSIS — Z1211 Encounter for screening for malignant neoplasm of colon: Secondary | ICD-10-CM | POA: Diagnosis not present

## 2017-02-05 DIAGNOSIS — Z79899 Other long term (current) drug therapy: Secondary | ICD-10-CM | POA: Diagnosis not present

## 2017-02-05 DIAGNOSIS — K219 Gastro-esophageal reflux disease without esophagitis: Secondary | ICD-10-CM | POA: Diagnosis not present

## 2017-02-05 DIAGNOSIS — B9681 Helicobacter pylori [H. pylori] as the cause of diseases classified elsewhere: Secondary | ICD-10-CM | POA: Diagnosis not present

## 2017-02-05 DIAGNOSIS — I10 Essential (primary) hypertension: Secondary | ICD-10-CM | POA: Diagnosis not present

## 2017-02-05 DIAGNOSIS — K2950 Unspecified chronic gastritis without bleeding: Secondary | ICD-10-CM | POA: Diagnosis not present

## 2017-02-05 DIAGNOSIS — R1909 Other intra-abdominal and pelvic swelling, mass and lump: Secondary | ICD-10-CM | POA: Diagnosis not present

## 2017-02-05 DIAGNOSIS — R05 Cough: Secondary | ICD-10-CM | POA: Diagnosis not present

## 2017-02-05 DIAGNOSIS — K449 Diaphragmatic hernia without obstruction or gangrene: Secondary | ICD-10-CM | POA: Diagnosis not present

## 2017-02-05 DIAGNOSIS — K2270 Barrett's esophagus without dysplasia: Secondary | ICD-10-CM | POA: Diagnosis not present

## 2017-02-05 DIAGNOSIS — E119 Type 2 diabetes mellitus without complications: Secondary | ICD-10-CM | POA: Diagnosis not present

## 2017-02-05 DIAGNOSIS — F17200 Nicotine dependence, unspecified, uncomplicated: Secondary | ICD-10-CM | POA: Diagnosis not present

## 2017-02-05 NOTE — Progress Notes (Signed)
Called Dr. Lyman BishopLawrence perry's office who stated patient has not been there since 2010 and do not see an A1c.

## 2017-02-07 NOTE — Anesthesia Preprocedure Evaluation (Addendum)
Anesthesia Evaluation    Reviewed: Allergy & Precautions, H&P , Patient's Chart, lab work & pertinent test results  Airway Mallampati: III  TM Distance: >3 FB Neck ROM: Full    Dental no notable dental hx. (+) Teeth Intact, Dental Advisory Given   Pulmonary sleep apnea , COPD,  COPD inhaler, Current Smoker,    Pulmonary exam normal breath sounds clear to auscultation       Cardiovascular hypertension, Pt. on medications and Pt. on home beta blockers + Peripheral Vascular Disease  + Valvular Problems/Murmurs AI  Rhythm:Regular Rate:Normal     Neuro/Psych Anxiety Depression negative neurological ROS  negative psych ROS   GI/Hepatic negative GI ROS, Neg liver ROS,   Endo/Other  diabetes, Type 2, Oral Hypoglycemic AgentsMorbid obesity  Renal/GU negative Renal ROS  negative genitourinary   Musculoskeletal   Abdominal   Peds  Hematology negative hematology ROS (+)   Anesthesia Other Findings   Reproductive/Obstetrics negative OB ROS                            Anesthesia Physical Anesthesia Plan  ASA: III  Anesthesia Plan: General   Post-op Pain Management:    Induction: Intravenous  PONV Risk Score and Plan: 4 or greater and Ondansetron, Dexamethasone, Propofol and Midazolam  Airway Management Planned: Oral ETT  Additional Equipment: Arterial line, CVP and Ultrasound Guidance Line Placement  Intra-op Plan:   Post-operative Plan: Extubation in OR and Possible Post-op intubation/ventilation  Informed Consent: I have reviewed the patients History and Physical, chart, labs and discussed the procedure including the risks, benefits and alternatives for the proposed anesthesia with the patient or authorized representative who has indicated his/her understanding and acceptance.   Dental advisory given  Plan Discussed with: CRNA  Anesthesia Plan Comments:        Anesthesia Quick  Evaluation

## 2017-02-08 ENCOUNTER — Inpatient Hospital Stay (HOSPITAL_COMMUNITY): Payer: BLUE CROSS/BLUE SHIELD | Admitting: Emergency Medicine

## 2017-02-08 ENCOUNTER — Inpatient Hospital Stay (HOSPITAL_COMMUNITY): Payer: BLUE CROSS/BLUE SHIELD | Admitting: Anesthesiology

## 2017-02-08 ENCOUNTER — Encounter (HOSPITAL_COMMUNITY): Payer: Self-pay | Admitting: Certified Registered"

## 2017-02-08 ENCOUNTER — Inpatient Hospital Stay (HOSPITAL_COMMUNITY)
Admission: RE | Admit: 2017-02-08 | Discharge: 2017-02-12 | DRG: 272 | Disposition: A | Discharge status: Home / Self Care | Payer: BLUE CROSS/BLUE SHIELD | Source: Ambulatory Visit | Attending: Vascular Surgery | Admitting: Vascular Surgery

## 2017-02-08 ENCOUNTER — Encounter (HOSPITAL_COMMUNITY)
Admission: RE | Disposition: A | Discharge status: Home / Self Care | Payer: Self-pay | Source: Ambulatory Visit | Attending: Vascular Surgery

## 2017-02-08 ENCOUNTER — Inpatient Hospital Stay (HOSPITAL_COMMUNITY): Payer: BLUE CROSS/BLUE SHIELD

## 2017-02-08 DIAGNOSIS — I119 Hypertensive heart disease without heart failure: Secondary | ICD-10-CM | POA: Diagnosis not present

## 2017-02-08 DIAGNOSIS — I429 Cardiomyopathy, unspecified: Secondary | ICD-10-CM | POA: Diagnosis not present

## 2017-02-08 DIAGNOSIS — G4733 Obstructive sleep apnea (adult) (pediatric): Secondary | ICD-10-CM | POA: Diagnosis not present

## 2017-02-08 DIAGNOSIS — Z6838 Body mass index (BMI) 38.0-38.9, adult: Secondary | ICD-10-CM | POA: Diagnosis not present

## 2017-02-08 DIAGNOSIS — F341 Dysthymic disorder: Secondary | ICD-10-CM | POA: Diagnosis present

## 2017-02-08 DIAGNOSIS — Z9889 Other specified postprocedural states: Secondary | ICD-10-CM

## 2017-02-08 DIAGNOSIS — Z79899 Other long term (current) drug therapy: Secondary | ICD-10-CM

## 2017-02-08 DIAGNOSIS — R0602 Shortness of breath: Secondary | ICD-10-CM | POA: Diagnosis not present

## 2017-02-08 DIAGNOSIS — F17210 Nicotine dependence, cigarettes, uncomplicated: Secondary | ICD-10-CM | POA: Diagnosis not present

## 2017-02-08 DIAGNOSIS — Z8249 Family history of ischemic heart disease and other diseases of the circulatory system: Secondary | ICD-10-CM | POA: Diagnosis not present

## 2017-02-08 DIAGNOSIS — I743 Embolism and thrombosis of arteries of the lower extremities: Secondary | ICD-10-CM | POA: Diagnosis not present

## 2017-02-08 DIAGNOSIS — I7410 Embolism and thrombosis of unspecified parts of aorta: Secondary | ICD-10-CM

## 2017-02-08 DIAGNOSIS — J449 Chronic obstructive pulmonary disease, unspecified: Secondary | ICD-10-CM | POA: Diagnosis not present

## 2017-02-08 DIAGNOSIS — Z7984 Long term (current) use of oral hypoglycemic drugs: Secondary | ICD-10-CM | POA: Diagnosis not present

## 2017-02-08 DIAGNOSIS — E1140 Type 2 diabetes mellitus with diabetic neuropathy, unspecified: Secondary | ICD-10-CM | POA: Diagnosis present

## 2017-02-08 DIAGNOSIS — I700 Atherosclerosis of aorta: Secondary | ICD-10-CM

## 2017-02-08 DIAGNOSIS — I70213 Atherosclerosis of native arteries of extremities with intermittent claudication, bilateral legs: Secondary | ICD-10-CM | POA: Diagnosis not present

## 2017-02-08 DIAGNOSIS — E785 Hyperlipidemia, unspecified: Secondary | ICD-10-CM | POA: Diagnosis present

## 2017-02-08 DIAGNOSIS — Z4682 Encounter for fitting and adjustment of non-vascular catheter: Secondary | ICD-10-CM | POA: Diagnosis not present

## 2017-02-08 DIAGNOSIS — K219 Gastro-esophageal reflux disease without esophagitis: Secondary | ICD-10-CM | POA: Diagnosis present

## 2017-02-08 DIAGNOSIS — I10 Essential (primary) hypertension: Secondary | ICD-10-CM | POA: Diagnosis not present

## 2017-02-08 DIAGNOSIS — E1151 Type 2 diabetes mellitus with diabetic peripheral angiopathy without gangrene: Secondary | ICD-10-CM | POA: Diagnosis not present

## 2017-02-08 DIAGNOSIS — Z800 Family history of malignant neoplasm of digestive organs: Secondary | ICD-10-CM

## 2017-02-08 DIAGNOSIS — Z833 Family history of diabetes mellitus: Secondary | ICD-10-CM | POA: Diagnosis not present

## 2017-02-08 DIAGNOSIS — I771 Stricture of artery: Secondary | ICD-10-CM | POA: Diagnosis not present

## 2017-02-08 DIAGNOSIS — I7409 Other arterial embolism and thrombosis of abdominal aorta: Principal | ICD-10-CM | POA: Diagnosis present

## 2017-02-08 DIAGNOSIS — Z7982 Long term (current) use of aspirin: Secondary | ICD-10-CM | POA: Diagnosis not present

## 2017-02-08 DIAGNOSIS — I70209 Unspecified atherosclerosis of native arteries of extremities, unspecified extremity: Secondary | ICD-10-CM | POA: Diagnosis present

## 2017-02-08 HISTORY — DX: Atherosclerosis of aorta: I70.0

## 2017-02-08 HISTORY — PX: AORTA - BILATERAL FEMORAL ARTERY BYPASS GRAFT: SHX1175

## 2017-02-08 LAB — CBC
HCT: 32.1 % — ABNORMAL LOW (ref 36.0–46.0)
HEMOGLOBIN: 10.3 g/dL — AB (ref 12.0–15.0)
MCH: 28.4 pg (ref 26.0–34.0)
MCHC: 32.1 g/dL (ref 30.0–36.0)
MCV: 88.4 fL (ref 78.0–100.0)
PLATELETS: 169 10*3/uL (ref 150–400)
RBC: 3.63 MIL/uL — AB (ref 3.87–5.11)
RDW: 17.1 % — ABNORMAL HIGH (ref 11.5–15.5)
WBC: 9.8 10*3/uL (ref 4.0–10.5)

## 2017-02-08 LAB — BLOOD GAS, ARTERIAL
Acid-Base Excess: 0.4 mmol/L (ref 0.0–2.0)
BICARBONATE: 26.1 mmol/L (ref 20.0–28.0)
O2 CONTENT: 8 L/min
O2 SAT: 93.3 %
PATIENT TEMPERATURE: 98.6
PO2 ART: 76.8 mmHg — AB (ref 83.0–108.0)
pCO2 arterial: 55 mmHg — ABNORMAL HIGH (ref 32.0–48.0)
pH, Arterial: 7.297 — ABNORMAL LOW (ref 7.350–7.450)

## 2017-02-08 LAB — GLUCOSE, CAPILLARY
GLUCOSE-CAPILLARY: 146 mg/dL — AB (ref 65–99)
GLUCOSE-CAPILLARY: 183 mg/dL — AB (ref 65–99)
GLUCOSE-CAPILLARY: 187 mg/dL — AB (ref 65–99)
Glucose-Capillary: 169 mg/dL — ABNORMAL HIGH (ref 65–99)
Glucose-Capillary: 198 mg/dL — ABNORMAL HIGH (ref 65–99)

## 2017-02-08 LAB — BASIC METABOLIC PANEL
Anion gap: 10 (ref 5–15)
BUN: 9 mg/dL (ref 6–20)
CHLORIDE: 102 mmol/L (ref 101–111)
CO2: 25 mmol/L (ref 22–32)
Calcium: 8.4 mg/dL — ABNORMAL LOW (ref 8.9–10.3)
Creatinine, Ser: 0.77 mg/dL (ref 0.44–1.00)
Glucose, Bld: 172 mg/dL — ABNORMAL HIGH (ref 65–99)
POTASSIUM: 3.5 mmol/L (ref 3.5–5.1)
SODIUM: 137 mmol/L (ref 135–145)

## 2017-02-08 LAB — MAGNESIUM: Magnesium: 1.6 mg/dL — ABNORMAL LOW (ref 1.7–2.4)

## 2017-02-08 LAB — PROTIME-INR
INR: 1.17
PROTHROMBIN TIME: 15 s (ref 11.4–15.2)

## 2017-02-08 LAB — APTT: APTT: 31 s (ref 24–36)

## 2017-02-08 SURGERY — CREATION, BYPASS, ARTERIAL, AORTA TO FEMORAL, BILATERAL, USING GRAFT
Anesthesia: General | Site: Abdomen

## 2017-02-08 MED ORDER — METOPROLOL TARTRATE 5 MG/5ML IV SOLN
2.0000 mg | INTRAVENOUS | Status: DC | PRN
  Administered 2017-02-08: 2 mg via INTRAVENOUS
  Filled 2017-02-08: qty 5

## 2017-02-08 MED ORDER — LABETALOL HCL 5 MG/ML IV SOLN
10.0000 mg | INTRAVENOUS | Status: AC | PRN
  Administered 2017-02-08 (×4): 10 mg via INTRAVENOUS
  Filled 2017-02-08 (×2): qty 4

## 2017-02-08 MED ORDER — FENTANYL CITRATE (PF) 100 MCG/2ML IJ SOLN
INTRAMUSCULAR | Status: DC | PRN
  Administered 2017-02-08: 50 ug via INTRAVENOUS
  Administered 2017-02-08 (×2): 100 ug via INTRAVENOUS
  Administered 2017-02-08: 150 ug via INTRAVENOUS
  Administered 2017-02-08: 50 ug via INTRAVENOUS

## 2017-02-08 MED ORDER — 0.9 % SODIUM CHLORIDE (POUR BTL) OPTIME
TOPICAL | Status: DC | PRN
  Administered 2017-02-08: 2000 mL

## 2017-02-08 MED ORDER — METFORMIN HCL 500 MG PO TABS
500.0000 mg | ORAL_TABLET | Freq: Two times a day (BID) | ORAL | Status: DC
  Filled 2017-02-08: qty 1

## 2017-02-08 MED ORDER — POTASSIUM CHLORIDE CRYS ER 20 MEQ PO TBCR: 20.0000 meq | EXTENDED_RELEASE_TABLET | Freq: Every day | ORAL | Status: DC | PRN

## 2017-02-08 MED ORDER — MIDAZOLAM HCL 2 MG/2ML IJ SOLN
INTRAMUSCULAR | Status: AC
  Filled 2017-02-08: qty 2

## 2017-02-08 MED ORDER — CARVEDILOL 25 MG PO TABS
25.0000 mg | ORAL_TABLET | Freq: Two times a day (BID) | ORAL | Status: DC
  Administered 2017-02-08: 25 mg via ORAL
  Filled 2017-02-08: qty 1

## 2017-02-08 MED ORDER — OXYCODONE-ACETAMINOPHEN 5-325 MG PO TABS: 1.0000 | ORAL_TABLET | ORAL | Status: DC | PRN

## 2017-02-08 MED ORDER — SODIUM CHLORIDE 0.9 % IV SOLN: INTRAVENOUS | Status: DC

## 2017-02-08 MED ORDER — INSULIN ASPART 100 UNIT/ML ~~LOC~~ SOLN
0.0000 [IU] | SUBCUTANEOUS | Status: DC
  Administered 2017-02-08: 2 [IU] via SUBCUTANEOUS

## 2017-02-08 MED ORDER — SODIUM CHLORIDE 0.9 % IV SOLN
INTRAVENOUS | Status: DC | PRN
  Administered 2017-02-08: 500 mL

## 2017-02-08 MED ORDER — HEPARIN SODIUM (PORCINE) 1000 UNIT/ML IJ SOLN
INTRAMUSCULAR | Status: DC | PRN
  Administered 2017-02-08: 10000 [IU] via INTRAVENOUS

## 2017-02-08 MED ORDER — PROPOFOL 10 MG/ML IV BOLUS
INTRAVENOUS | Status: AC
  Filled 2017-02-08: qty 20

## 2017-02-08 MED ORDER — SUCCINYLCHOLINE CHLORIDE 200 MG/10ML IV SOSY
PREFILLED_SYRINGE | INTRAVENOUS | Status: AC
  Filled 2017-02-08: qty 10

## 2017-02-08 MED ORDER — ALUM & MAG HYDROXIDE-SIMETH 200-200-20 MG/5ML PO SUSP: 15.0000 mL | ORAL | Status: DC | PRN

## 2017-02-08 MED ORDER — KCL IN DEXTROSE-NACL 20-5-0.45 MEQ/L-%-% IV SOLN
INTRAVENOUS | Status: DC
  Administered 2017-02-08 – 2017-02-11 (×8): via INTRAVENOUS
  Filled 2017-02-08 (×9): qty 1000

## 2017-02-08 MED ORDER — HYDROMORPHONE HCL 1 MG/ML IJ SOLN
INTRAMUSCULAR | Status: AC
  Filled 2017-02-08: qty 0.5

## 2017-02-08 MED ORDER — ASPIRIN EC 81 MG PO TBEC
162.0000 mg | DELAYED_RELEASE_TABLET | Freq: Every day | ORAL | Status: DC
  Filled 2017-02-08: qty 2

## 2017-02-08 MED ORDER — ACETAMINOPHEN 325 MG PO TABS
325.0000 mg | ORAL_TABLET | ORAL | Status: DC | PRN
Start: 2017-02-08 — End: 2017-02-12

## 2017-02-08 MED ORDER — ROCURONIUM BROMIDE 100 MG/10ML IV SOLN
INTRAVENOUS | Status: DC | PRN
  Administered 2017-02-08 (×3): 10 mg via INTRAVENOUS
  Administered 2017-02-08: 60 mg via INTRAVENOUS

## 2017-02-08 MED ORDER — PHENOL 1.4 % MT LIQD
1.0000 | OROMUCOSAL | Status: DC | PRN
Start: 2017-02-08 — End: 2017-02-12

## 2017-02-08 MED ORDER — PROPOFOL 10 MG/ML IV BOLUS
INTRAVENOUS | Status: DC | PRN
  Administered 2017-02-08: 140 mg via INTRAVENOUS

## 2017-02-08 MED ORDER — PANTOPRAZOLE SODIUM 40 MG PO TBEC: 40.0000 mg | DELAYED_RELEASE_TABLET | Freq: Every day | ORAL | Status: DC

## 2017-02-08 MED ORDER — METOPROLOL TARTRATE 5 MG/5ML IV SOLN
2.5000 mg | Freq: Four times a day (QID) | INTRAVENOUS | Status: DC
  Administered 2017-02-08 – 2017-02-10 (×7): 2.5 mg via INTRAVENOUS
  Filled 2017-02-08 (×7): qty 5

## 2017-02-08 MED ORDER — CEFUROXIME SODIUM 1.5 G IV SOLR
1.5000 g | Freq: Two times a day (BID) | INTRAVENOUS | Status: AC
  Administered 2017-02-08 – 2017-02-09 (×2): 1.5 g via INTRAVENOUS
  Filled 2017-02-08 (×2): qty 1.5

## 2017-02-08 MED ORDER — INSULIN ASPART 100 UNIT/ML ~~LOC~~ SOLN
0.0000 [IU] | SUBCUTANEOUS | Status: DC
  Administered 2017-02-08 – 2017-02-09 (×3): 3 [IU] via SUBCUTANEOUS
  Administered 2017-02-09: 2 [IU] via SUBCUTANEOUS
  Administered 2017-02-09 (×4): 3 [IU] via SUBCUTANEOUS
  Administered 2017-02-10 – 2017-02-11 (×6): 2 [IU] via SUBCUTANEOUS

## 2017-02-08 MED ORDER — ATORVASTATIN CALCIUM 10 MG PO TABS: 10.0000 mg | ORAL_TABLET | Freq: Every day | ORAL | Status: DC

## 2017-02-08 MED ORDER — FENTANYL CITRATE (PF) 250 MCG/5ML IJ SOLN
INTRAMUSCULAR | Status: AC
  Filled 2017-02-08: qty 5

## 2017-02-08 MED ORDER — HYDRALAZINE HCL 20 MG/ML IJ SOLN
5.0000 mg | INTRAMUSCULAR | Status: AC | PRN
  Administered 2017-02-09 (×2): 5 mg via INTRAVENOUS
  Filled 2017-02-08 (×2): qty 1

## 2017-02-08 MED ORDER — SUGAMMADEX SODIUM 200 MG/2ML IV SOLN
INTRAVENOUS | Status: AC
  Filled 2017-02-08: qty 2

## 2017-02-08 MED ORDER — PHENYLEPHRINE 40 MCG/ML (10ML) SYRINGE FOR IV PUSH (FOR BLOOD PRESSURE SUPPORT)
PREFILLED_SYRINGE | INTRAVENOUS | Status: AC
  Filled 2017-02-08: qty 10

## 2017-02-08 MED ORDER — ACETAMINOPHEN 325 MG RE SUPP
325.0000 mg | RECTAL | Status: DC | PRN
Start: 2017-02-08 — End: 2017-02-12

## 2017-02-08 MED ORDER — ALBUMIN HUMAN 5 % IV SOLN
INTRAVENOUS | Status: DC | PRN
  Administered 2017-02-08: 11:00:00 via INTRAVENOUS

## 2017-02-08 MED ORDER — ONDANSETRON HCL 4 MG/2ML IJ SOLN
INTRAMUSCULAR | Status: AC
  Filled 2017-02-08: qty 2

## 2017-02-08 MED ORDER — BISACODYL 10 MG RE SUPP: 10.0000 mg | Freq: Every day | RECTAL | Status: DC | PRN

## 2017-02-08 MED ORDER — MAGNESIUM SULFATE 2 GM/50ML IV SOLN
2.0000 g | Freq: Every day | INTRAVENOUS | Status: DC | PRN
  Filled 2017-02-08: qty 50

## 2017-02-08 MED ORDER — IRBESARTAN 300 MG PO TABS
300.0000 mg | ORAL_TABLET | Freq: Every day | ORAL | Status: DC
  Filled 2017-02-08: qty 1

## 2017-02-08 MED ORDER — MIDAZOLAM HCL 5 MG/5ML IJ SOLN
INTRAMUSCULAR | Status: DC | PRN
  Administered 2017-02-08: 2 mg via INTRAVENOUS

## 2017-02-08 MED ORDER — PHENYLEPHRINE HCL 10 MG/ML IJ SOLN
INTRAVENOUS | Status: DC | PRN
  Administered 2017-02-08: 20 ug/min via INTRAVENOUS

## 2017-02-08 MED ORDER — EPHEDRINE 5 MG/ML INJ
INTRAVENOUS | Status: AC
  Filled 2017-02-08: qty 10

## 2017-02-08 MED ORDER — CHLORHEXIDINE GLUCONATE 4 % EX LIQD: 60.0000 mL | Freq: Once | CUTANEOUS | Status: DC

## 2017-02-08 MED ORDER — DEXAMETHASONE SODIUM PHOSPHATE 10 MG/ML IJ SOLN
INTRAMUSCULAR | Status: DC | PRN
  Administered 2017-02-08: 8 mg via INTRAVENOUS

## 2017-02-08 MED ORDER — METOPROLOL TARTRATE 5 MG/5ML IV SOLN: 2.5000 mg | Freq: Four times a day (QID) | INTRAVENOUS | Status: DC

## 2017-02-08 MED ORDER — PROTAMINE SULFATE 10 MG/ML IV SOLN
INTRAVENOUS | Status: AC
  Filled 2017-02-08: qty 5

## 2017-02-08 MED ORDER — GABAPENTIN 300 MG PO CAPS
300.0000 mg | ORAL_CAPSULE | Freq: Three times a day (TID) | ORAL | Status: DC
  Administered 2017-02-08: 300 mg via ORAL
  Filled 2017-02-08: qty 1

## 2017-02-08 MED ORDER — HEPARIN SODIUM (PORCINE) 1000 UNIT/ML IJ SOLN
INTRAMUSCULAR | Status: AC
  Filled 2017-02-08: qty 1

## 2017-02-08 MED ORDER — ONDANSETRON HCL 4 MG/2ML IJ SOLN
INTRAMUSCULAR | Status: DC | PRN
  Administered 2017-02-08: 4 mg via INTRAVENOUS

## 2017-02-08 MED ORDER — DOCUSATE SODIUM 100 MG PO CAPS: 100.0000 mg | ORAL_CAPSULE | Freq: Every day | ORAL | Status: DC

## 2017-02-08 MED ORDER — DEXTROSE 5 % IV SOLN
1.5000 g | INTRAVENOUS | Status: AC
  Administered 2017-02-08 (×2): 1.5 g via INTRAVENOUS
  Filled 2017-02-08: qty 1.5

## 2017-02-08 MED ORDER — INSULIN ASPART 100 UNIT/ML ~~LOC~~ SOLN: 0.0000 [IU] | Freq: Three times a day (TID) | SUBCUTANEOUS | Status: DC

## 2017-02-08 MED ORDER — ROCURONIUM BROMIDE 10 MG/ML (PF) SYRINGE
PREFILLED_SYRINGE | INTRAVENOUS | Status: AC
  Filled 2017-02-08: qty 5

## 2017-02-08 MED ORDER — OLMESARTAN-AMLODIPINE-HCTZ 40-10-25 MG PO TABS: 1.0000 | ORAL_TABLET | Freq: Every day | ORAL | Status: DC

## 2017-02-08 MED ORDER — PHENYLEPHRINE 40 MCG/ML (10ML) SYRINGE FOR IV PUSH (FOR BLOOD PRESSURE SUPPORT)
PREFILLED_SYRINGE | INTRAVENOUS | Status: DC | PRN
  Administered 2017-02-08 (×4): 80 ug via INTRAVENOUS

## 2017-02-08 MED ORDER — LIDOCAINE 2% (20 MG/ML) 5 ML SYRINGE
INTRAMUSCULAR | Status: AC
  Filled 2017-02-08: qty 5

## 2017-02-08 MED ORDER — DEXAMETHASONE SODIUM PHOSPHATE 10 MG/ML IJ SOLN
INTRAMUSCULAR | Status: AC
  Filled 2017-02-08: qty 1

## 2017-02-08 MED ORDER — ONDANSETRON HCL 4 MG/2ML IJ SOLN: 4.0000 mg | Freq: Four times a day (QID) | INTRAMUSCULAR | Status: DC | PRN

## 2017-02-08 MED ORDER — HYDROMORPHONE HCL 1 MG/ML IJ SOLN
0.2500 mg | INTRAMUSCULAR | Status: DC | PRN
  Administered 2017-02-08: 0.5 mg via INTRAVENOUS

## 2017-02-08 MED ORDER — DEXTROSE 5 % IV SOLN
1.5000 g | INTRAVENOUS | Status: DC
  Filled 2017-02-08: qty 1.5

## 2017-02-08 MED ORDER — SODIUM CHLORIDE 0.9 % IV SOLN: 500.0000 mL | Freq: Once | INTRAVENOUS | Status: DC | PRN

## 2017-02-08 MED ORDER — GUAIFENESIN-DM 100-10 MG/5ML PO SYRP: 15.0000 mL | ORAL_SOLUTION | ORAL | Status: DC | PRN

## 2017-02-08 MED ORDER — HYDROCHLOROTHIAZIDE 25 MG PO TABS: 25.0000 mg | ORAL_TABLET | Freq: Every day | ORAL | Status: DC

## 2017-02-08 MED ORDER — MORPHINE SULFATE (PF) 4 MG/ML IV SOLN
1.0000 mg | INTRAVENOUS | Status: DC | PRN
  Administered 2017-02-08 (×2): 2 mg via INTRAVENOUS
  Administered 2017-02-08 – 2017-02-09 (×2): 3 mg via INTRAVENOUS
  Administered 2017-02-09: 2 mg via INTRAVENOUS
  Administered 2017-02-09: 3 mg via INTRAVENOUS
  Administered 2017-02-09: 2 mg via INTRAVENOUS
  Administered 2017-02-10: 3 mg via INTRAVENOUS
  Filled 2017-02-08 (×9): qty 1

## 2017-02-08 MED ORDER — HYDROCHLOROTHIAZIDE 25 MG PO TABS
50.0000 mg | ORAL_TABLET | Freq: Every day | ORAL | Status: DC
  Filled 2017-02-08: qty 2

## 2017-02-08 MED ORDER — PANTOPRAZOLE SODIUM 40 MG IV SOLR
40.0000 mg | INTRAVENOUS | Status: DC
  Administered 2017-02-08 – 2017-02-11 (×4): 40 mg via INTRAVENOUS
  Filled 2017-02-08 (×4): qty 40

## 2017-02-08 MED ORDER — CITALOPRAM HYDROBROMIDE 20 MG PO TABS: 20.0000 mg | ORAL_TABLET | Freq: Every day | ORAL | Status: DC

## 2017-02-08 MED ORDER — ALBUTEROL SULFATE (2.5 MG/3ML) 0.083% IN NEBU: 3.0000 mL | INHALATION_SOLUTION | Freq: Four times a day (QID) | RESPIRATORY_TRACT | Status: DC | PRN

## 2017-02-08 MED ORDER — MANNITOL 25 % IV SOLN
INTRAVENOUS | Status: DC | PRN
  Administered 2017-02-08: 25 g via INTRAVENOUS

## 2017-02-08 MED ORDER — PROTAMINE SULFATE 10 MG/ML IV SOLN
INTRAVENOUS | Status: DC | PRN
  Administered 2017-02-08: 50 mg via INTRAVENOUS

## 2017-02-08 MED ORDER — LACTATED RINGERS IV SOLN
INTRAVENOUS | Status: DC | PRN
  Administered 2017-02-08 (×3): via INTRAVENOUS

## 2017-02-08 MED ORDER — AMLODIPINE BESYLATE 10 MG PO TABS
10.0000 mg | ORAL_TABLET | Freq: Every day | ORAL | Status: DC
  Filled 2017-02-08: qty 1

## 2017-02-08 MED ORDER — ENOXAPARIN SODIUM 40 MG/0.4ML ~~LOC~~ SOLN
40.0000 mg | SUBCUTANEOUS | Status: DC
  Administered 2017-02-09 – 2017-02-11 (×3): 40 mg via SUBCUTANEOUS
  Filled 2017-02-08 (×3): qty 0.4

## 2017-02-08 MED ORDER — EPHEDRINE SULFATE-NACL 50-0.9 MG/10ML-% IV SOSY
PREFILLED_SYRINGE | INTRAVENOUS | Status: DC | PRN
  Administered 2017-02-08 (×2): 5 mg via INTRAVENOUS

## 2017-02-08 MED ORDER — SUGAMMADEX SODIUM 200 MG/2ML IV SOLN
INTRAVENOUS | Status: DC | PRN
  Administered 2017-02-08: 200 mg via INTRAVENOUS

## 2017-02-08 MED FILL — Heparin Sodium (Porcine) Inj 1000 Unit/ML: INTRAMUSCULAR | Qty: 30 | Status: AC

## 2017-02-08 MED FILL — Sodium Chloride IV Soln 0.9%: INTRAVENOUS | Qty: 1000 | Status: AC

## 2017-02-08 SURGICAL SUPPLY — 60 items
CANISTER SUCT 3000ML PPV (MISCELLANEOUS) ×2 IMPLANT
CANNULA VESSEL 3MM 2 BLNT TIP (CANNULA) ×4 IMPLANT
CLIP LIGATING EXTRA MED SLVR (CLIP) ×2 IMPLANT
CLIP LIGATING EXTRA SM BLUE (MISCELLANEOUS) ×2 IMPLANT
COVER BACK TABLE 60X90IN (DRAPES) ×2 IMPLANT
DERMABOND ADHESIVE PROPEN (GAUZE/BANDAGES/DRESSINGS) ×3
DERMABOND ADVANCED (GAUZE/BANDAGES/DRESSINGS) ×2
DERMABOND ADVANCED .7 DNX12 (GAUZE/BANDAGES/DRESSINGS) ×2 IMPLANT
DERMABOND ADVANCED .7 DNX6 (GAUZE/BANDAGES/DRESSINGS) ×3 IMPLANT
DRAPE BILATERAL SPLIT (DRAPES) IMPLANT
DRAPE CV SPLIT W-CLR ANES SCRN (DRAPES) IMPLANT
DRAPE INCISE IOBAN 66X45 STRL (DRAPES) ×2 IMPLANT
ELECT BLADE 4.0 EZ CLEAN MEGAD (MISCELLANEOUS)
ELECT REM PT RETURN 9FT ADLT (ELECTROSURGICAL) ×2
ELECTRODE BLDE 4.0 EZ CLN MEGD (MISCELLANEOUS) IMPLANT
ELECTRODE REM PT RTRN 9FT ADLT (ELECTROSURGICAL) ×1 IMPLANT
FELT TEFLON 1X6 (MISCELLANEOUS) ×2 IMPLANT
GLOVE BIO SURGEON STRL SZ7.5 (GLOVE) ×2 IMPLANT
GLOVE BIOGEL PI IND STRL 7.0 (GLOVE) ×1 IMPLANT
GLOVE BIOGEL PI IND STRL 7.5 (GLOVE) ×1 IMPLANT
GLOVE BIOGEL PI IND STRL 8 (GLOVE) ×1 IMPLANT
GLOVE BIOGEL PI INDICATOR 7.0 (GLOVE) ×1
GLOVE BIOGEL PI INDICATOR 7.5 (GLOVE) ×1
GLOVE BIOGEL PI INDICATOR 8 (GLOVE) ×1
GLOVE ECLIPSE 7.0 STRL STRAW (GLOVE) ×4 IMPLANT
GLOVE SS BIOGEL STRL SZ 7.5 (GLOVE) ×1 IMPLANT
GLOVE SUPERSENSE BIOGEL SZ 7.5 (GLOVE) ×1
GLOVE SURG SS PI 6.5 STRL IVOR (GLOVE) ×2 IMPLANT
GOWN STRL REUS W/ TWL LRG LVL3 (GOWN DISPOSABLE) ×8 IMPLANT
GOWN STRL REUS W/TWL LRG LVL3 (GOWN DISPOSABLE) ×8
GRAFT HEMASHIELD 16X8MM (Vascular Products) ×2 IMPLANT
INSERT FOGARTY 61MM (MISCELLANEOUS) ×2 IMPLANT
INSERT FOGARTY SM (MISCELLANEOUS) ×4 IMPLANT
INSERT FOGARTY XLG (MISCELLANEOUS) ×2 IMPLANT
KIT BASIN OR (CUSTOM PROCEDURE TRAY) ×2 IMPLANT
KIT ROOM TURNOVER OR (KITS) ×2 IMPLANT
NS IRRIG 1000ML POUR BTL (IV SOLUTION) ×4 IMPLANT
PACK AORTA (CUSTOM PROCEDURE TRAY) ×2 IMPLANT
PAD ARMBOARD 7.5X6 YLW CONV (MISCELLANEOUS) ×4 IMPLANT
SPONGE LAP 18X18 X RAY DECT (DISPOSABLE) IMPLANT
SUT ETHIBOND 5 LR DA (SUTURE) IMPLANT
SUT PDS AB 1 TP1 54 (SUTURE) ×4 IMPLANT
SUT PROLENE 3 0 SH1 36 (SUTURE) ×4 IMPLANT
SUT PROLENE 5 0 C 1 24 (SUTURE) ×8 IMPLANT
SUT PROLENE 5 0 C 1 36 (SUTURE) IMPLANT
SUT SILK 2 0 (SUTURE) ×1
SUT SILK 2 0 SH CR/8 (SUTURE) ×2 IMPLANT
SUT SILK 2 0 TIES 17X18 (SUTURE) ×1
SUT SILK 2-0 18XBRD TIE 12 (SUTURE) ×1 IMPLANT
SUT SILK 2-0 18XBRD TIE BLK (SUTURE) ×1 IMPLANT
SUT SILK 3 0 (SUTURE) ×1
SUT SILK 3 0 TIES 17X18 (SUTURE) ×1
SUT SILK 3-0 18XBRD TIE 12 (SUTURE) ×1 IMPLANT
SUT SILK 3-0 18XBRD TIE BLK (SUTURE) ×1 IMPLANT
SUT VIC AB 2-0 CT1 36 (SUTURE) ×6 IMPLANT
SUT VIC AB 3-0 SH 27 (SUTURE) ×4
SUT VIC AB 3-0 SH 27X BRD (SUTURE) ×4 IMPLANT
TOWEL BLUE STERILE X RAY DET (MISCELLANEOUS) ×4 IMPLANT
TRAY FOLEY W/METER SILVER 16FR (SET/KITS/TRAYS/PACK) ×2 IMPLANT
WATER STERILE IRR 1000ML POUR (IV SOLUTION) ×4 IMPLANT

## 2017-02-08 NOTE — Progress Notes (Signed)
Fields MD paged regarding patient's CBG in the 190s but no current insulin coverage. Orders received.   Lori PorchBradley [name]

## 2017-02-08 NOTE — Anesthesia Procedure Notes (Signed)
Central Venous Catheter Insertion Performed by: Gaynelle AduFITZGERALD, Avaiah Stempel, anesthesiologist Start/End6/07/2017 7:29 AM, 02/08/2017 7:39 AM Patient location: Pre-op. Preanesthetic checklist: patient identified, IV checked, site marked, risks and benefits discussed, surgical consent, monitors and equipment checked, pre-op evaluation, timeout performed and anesthesia consent Position: Trendelenburg Lidocaine 1% used for infiltration and patient sedated Hand hygiene performed , maximum sterile barriers used  and Seldinger technique used Catheter size: 8 Fr Total catheter length 16. Central line was placed.Double lumen Procedure performed using ultrasound guided technique. Ultrasound Notes:anatomy identified, needle tip was noted to be adjacent to the nerve/plexus identified, no ultrasound evidence of intravascular and/or intraneural injection and image(s) printed for medical record Attempts: 1 Following insertion, dressing applied, line sutured and Biopatch. Post procedure assessment: blood return through all ports  Patient tolerated the procedure well with no immediate complications.

## 2017-02-08 NOTE — Transfer of Care (Signed)
Immediate Anesthesia Transfer of Care Note  Patient: Lori Carter  Procedure(s) Performed: Procedure(s): AORTA BIFEMORAL BYPASS GRAFT (N/A)  Patient Location: PACU  Anesthesia Type:General  Level of Consciousness: drowsy  Airway & Oxygen Therapy: Patient Spontanous Breathing and Patient connected to face mask oxygen  Post-op Assessment: Report given to RN and Post -op Vital signs reviewed and stable  Post vital signs: Reviewed and stable  Last Vitals:  Vitals:   02/08/17 0608  BP: (!) 157/75  Pulse: 77  Resp: 20  Temp: 36.7 C    Last Pain:  Vitals:   02/08/17 0608  TempSrc: Oral      Patients Stated Pain Goal: 2 (02/08/17 [id number])  Complications: No apparent anesthesia complications

## 2017-02-08 NOTE — Progress Notes (Signed)
  Day of Surgery Note    Subjective:  Sleeping-awakes to voice.  RN giving pt po meds   Vitals:   02/08/17 1417 02/08/17 1500  BP:  132/77  Pulse: 80 82  Resp: 16 16  Temp:  98.4 F (36.9 C)    Incisions:   Midline incision is clean and dry Extremities:  Bilateral feet are warm; sensation and motor are in tact.  Cardiac:  regular Lungs:  Non labored Abdomen:  Somewhat distended; tender to palpation.    Assessment/Plan:  This is a 51 y.o. female who is s/p aortobifemoral bypass grafting.  -no pills via by mouth as she does not have any gut function at this time and keep strict npo.  Po meds discontinued in orders.   -will order metoprolol scheduled IV as 2.5mg  IV q6h and if we need to increase we can.  -bilateral feet are warm and well perfused.  -will start to mobilize out of bed tomorrow.   Doreatha MassedSamantha Charrise Lardner, PA-C 02/08/2017 4:23 PM [phone]

## 2017-02-08 NOTE — Interval H&P Note (Signed)
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 After consideration of risks, benefits and other options for treatment, the patient has consented to  Procedure(s): AORTA BIFEMORAL BYPASS GRAFT (N/A) as a surgical intervention .  The patient's history has been reviewed, patient examined, no change in status, stable for surgery.  I have reviewed the patient's chart and labs.  Questions were answered to the patient's satisfaction.     Gretta BeganEarly, Dorien Mayotte

## 2017-02-08 NOTE — Op Note (Signed)
OPERATIVE REPORT  DATE OF SURGERY: 02/08/2017  PATIENT: Lori Carter, 51 y.o. female MRN: [id number]  DOB: [birth year]  PRE-OPERATIVE DIAGNOSIS: Lower extremity arterial insufficiency  POST-OPERATIVE DIAGNOSIS:  Same  PROCEDURE: Aortobifemoral bypass and left common femoral endarterectomy  SURGEON:  Gretta Beganodd Akim Watkinson, M.D.  PHYSICIAN ASSISTANT: Dr. Cari Carawayhris Dickson, Lianne CureMaureen Collins PA-C  ANESTHESIA:  GEN  EBL: 200 ml  Total I/O In: 3250 [I.V.:3000; IV Piggyback:250] Out: 825 [Urine:625; Blood:200]  BLOOD ADMINISTERED: None  DRAINS: None  SPECIMEN: None  COUNTS CORRECT:  YES  PLAN OF CARE: PACU extubated   PATIENT DISPOSITION:  PACU - hemodynamically stable  PROCEDURE DETAILS: The patient was taken the operating room placed supine position where the area the abdomen both groins prepped in sterile fashion incision was made from below the xiphoid to below the umbilicus and carried down through the subcutaneous fat in the midline fascia. The midline fascia was opened with left cautery in line with skin incision. The abdomen and peritoneal contents were entered. The liver and gallbladder were normal. The stomach was normal and the NG tube was in good positioning. The large and small bowel are normal. The omni-Trac retractor was used for exposure. The transverse colon and omentum reflected sparely and the small bowel was reflected to the right. The retroperitoneum was opened and the aorta was exposed and had the calcification but had good flow to this level. The aorta was encircled below the level of the renal arteries at the level of the left renal vein. Dissection was continued down to the aortic bifurcation and the common iliac arteries were exposed bilaterally. Next separate incisions were made over each groin and carried down through the subcutaneous fat to the level of the femoral sheath which was opened. The common superficial femoral and profundus femoris arteries were isolated  bilaterally and encircled with Vesseloops. Tunnels were created from the level of the groin to the iliac bifurcation for eventual placement of the aortobifemoral bypass. Crossing vein at the inguinal ligament was ligated and divided bilaterally. The patient was given 25 mg grams of mannitol and 10,000 units of intravenous heparin. After adequate circulation time the aorta was occluded below the level the renal arteries and above the level of the inferior mesenteric artery. The aorta was transected and a segment of aorta was removed. The 16 x 8 Hemashield graft was brought to the field and was cut to appropriate length with a short aortic trunk. Using a felt strip reinforcement the graft was sewn into into the aorta with a running 3-0 Prolene suture. This anastomosis was tested and found to be adequate. The right and left limbs of the graft were flushed and then were passed down to the respective groins through the prior created tunnels. The common superficial femoral and profundus reverse arteries were occluded bilaterally in the common femoral arteries are open bilaterally. On the right the superficial femoral artery was occluded at its origin. The right anastomosis to the junction of the common and superficial femoral artery this was done with a running 5-0 Prolene suture. 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 The common femoral artery was opened and the was endarterectomized under the inguinal ligament with good backbleeding inflow noted from the external iliac artery. The superficial femoral artery was chronically occluded. The endarterectomy was continued down into the profundus femoris arteries with several branches being endarterectomized with a nice feathering endpoint and excellent backbleeding. The left limb of the graft was cut to appropriate  length and was sewn on the junction of the common and superficial femoral arteries again with a running 5-0 Prolene suture. Prior to  completion of each closure the usual flushing maneuvers were undertaken. Anastomosis completed and the flows restored first to the right leg and in the left leg. The patient was given 50 mg of protamine to reverse the heparin. Wounds irrigated with saline. Hemostasis cautery. Wounds were closed with 2-0 Vicryl in several layers and the groin followed by skin closure with 3-0 subcuticular Vicryl suture. The abdomen the retroperitoneum was closed over the aortofemoral graft with a running 2-0 Vicryl suture. The small bowel was run in its entirety and found to be without injury was placed back in the pelvis. The transverse colon omentum replaced over this. The midline fascia was closed with a #1 PDS suture beginning proximally and distally and tying in the middle. The skin was closed with 3-0 subcuticular Vicryl suture. The patient was extubated in the operating room. Had good Doppler flow to both feet and was transferred to the recovery room in stable condition   Larina Earthly, M.D., [facility] 02/08/2017 1:07 PM

## 2017-02-08 NOTE — H&P (View-Only) (Signed)
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 female who was seen for evaluation of lower external ureter insufficiency by Dr. Arbie CookeyEarly on 12/22/16. She works as a Clinical biochemistCMA and has had progressive changes over the last several months of inability to walk any distance without pain. She reports that this is a 18 cramping sensation that begins in her calves and can extend into her thighs. She reports makes it difficult for her to work having to stop multiple times. She denies any tissue loss with specifically no ulcerations on her feet. She does have a history in the past approximately 10 years ago with a 7 day admission for possible cardiac issues but has never been told specifically that she had a myocardial infarction. She does have a family history of cardiac and peripheral vascular occlusive disease. Does have a long history of cigarette smoking.  She had ABI's of 0.4 bilaterally.  Dr. Arbie CookeyEarly recommended CTA and return for results.  She is here today to discuss the results.  She does continue to have claudication with walking short distances. She does not have pain at rest.  She does have some cramping at night on the top of her foot.  She does not have non healing wounds.    She is on a statin for cholesterol management.  She is a beta blocker, ARB,  CCB and HCTZ for blood pressure management. She is on Metformin for her DM.  She takes a daily aspirin.   Past Medical History:  Diagnosis Date  . Acne   . Anxiety   . Atopic neurodermatitis   . Cardiomegaly   . Cellulitis of toe of left foot    acute  . COPD (chronic obstructive pulmonary disease) (HCC)   . Dysthymic disorder   . Epidermal inclusion cyst   . GERD (gastroesophageal reflux disease)   . Hyperlipidemia   . Hypertension, benign    chronic  . Obstructive sleep apnea   . Type 2 diabetes mellitus without complication  (HCC)    chronic    Past Surgical History:  Procedure Laterality Date  . none      No Known Allergies  Current Outpatient Prescriptions  Medication Sig Dispense Refill  . albuterol (PROVENTIL HFA;VENTOLIN HFA) 108 (90 Base) MCG/ACT inhaler Inhale 2 puffs into the lungs every 6 (six) hours as needed for wheezing or shortness of breath.    Marland Kitchen. aspirin 325 MG EC tablet Take 325 mg by mouth daily.    Marland Kitchen. atorvastatin (LIPITOR) 10 MG tablet Take 10 mg by mouth daily.    . carvedilol (COREG) 25 MG tablet Take 25 mg by mouth 2 (two) times daily with a meal.    . citalopram (CELEXA) 20 MG tablet Take 20 mg by mouth daily.    . cyclobenzaprine (FLEXERIL) 10 MG tablet Take 5 mg by mouth daily as needed for muscle spasms.    . Emollient (LUBRIDERM) LOTN Apply 1 application topically daily.    Marland Kitchen. glucose blood (FREESTYLE LITE) test strip 1 each by Other route as needed for other. Use as instructed    . hydrochlorothiazide (HYDRODIURIL) 25 MG tablet Take 12.5 mg by mouth 2 (two) times daily.    Marland Kitchen. HYDROcodone-acetaminophen (NORCO/VICODIN) 5-325 MG tablet Take 5-325 tablets by mouth every 4 (four) hours as needed for pain.  0  . Lancets (FREESTYLE) lancets 1 each by Other route as  needed for other. Use as instructed    . Loratadine (CLARITIN) 10 MG CAPS Take 10 mg by mouth daily as needed (allergies).    . meloxicam (MOBIC) 7.5 MG tablet Take 7.5 mg by mouth daily as needed for pain.  0  . metFORMIN (GLUCOPHAGE) 500 MG tablet Take 500 mg by mouth 2 (two) times daily with a meal.    . Olmesartan-Amlodipine-HCTZ (TRIBENZOR) 40-10-25 MG TABS Take 1 tablet by mouth daily.    . varenicline (CHANTIX STARTING MONTH PAK) 0.5 MG X 11 & 1 MG X 42 tablet Take one 0.5 mg tablet by mouth once daily for 3 days, then increase to one 0.5 mg tablet twice daily for 4 days, then increase to one 1 mg tablet twice daily. 53 tablet 0   No current facility-administered medications for this visit.     Family History  Problem  Relation Age of Onset  . Hypertension Mother   . Clotting disorder Mother   . Heart murmur Father   . Pancreatic cancer Father   . Diabetes Father   . Hypertension Father     Social History   Social History  . Marital status: Divorced    Spouse name: N/A  . Number of children: N/A  . Years of education: N/A   Occupational History  . CNA    Social History Main Topics  . Smoking status: Light Tobacco Smoker  . Smokeless tobacco: Never Used     Comment: 1 pk lasts 4 days.  . Alcohol use No  . Drug use: No  . Sexual activity: Not on file   Other Topics Concern  . Not on file   Social History Narrative  . No narrative on file     REVIEW OF SYSTEMS:   [X]  denotes positive finding, [ ]  denotes negative finding Cardiac  Comments:  Chest pain or chest pressure:    Shortness of breath upon exertion: x   Short of breath when lying flat: x   Irregular heart rhythm: x       Vascular    Pain in calf, thigh, or hip brought on by ambulation: x   Pain in feet at night that wakes you up from your sleep:  x   Blood clot in your veins:    Leg swelling:  x       Pulmonary    Oxygen at home:    Productive cough:  x   Wheezing:         Neurologic    Sudden weakness in arms or legs:  x Legs  Sudden numbness in arms or legs:  x Legs  Sudden onset of difficulty speaking or slurred speech:    Temporary loss of vision in one eye:     Problems with dizziness:         Gastrointestinal    Blood in stool:     Vomited blood:         Genitourinary    Burning when urinating:     Blood in urine:        Psychiatric    Major depression:         Hematologic    Bleeding problems:    Problems with blood clotting too easily:        Skin    Rashes or ulcers:        Constitutional    Fever or chills:      PHYSICAL EXAMINATION:  Vitals:   01/19/17 1417  BP: Marland Kitchen)  154/83  Pulse: 85  Resp: 20  Temp: 98.9 F (37.2 C)   Body mass index is 38.18 kg/m.  General:  WDWN in  NAD; vital signs documented above Gait: Not observed HENT: WNL, normocephalic Pulmonary: normal non-labored breathing Cardiac: regular HR Abdomen: soft, NT, no masses Skin: without rashes Vascular Exam/Pulses:  Right Left  Radial 2+ (normal) 2+ (normal)  Femoral absent absent  Popliteal absent absent  DP absent absent  PT absent absent   Extremities: without ischemic changes, without Gangrene , without cellulitis; without open wounds;  Musculoskeletal: no muscle wasting or atrophy  Neurologic: A&O X 3;  No focal weakness or paresthesias are detected Psychiatric:  The pt has Normal affect.   Non-Invasive Vascular Imaging:   [facility]  CT Angiogram abdomen/pelvis with runoff 01/05/17: Impression Vascular: -Aortic atherosclerosis without aneurysm or occlusive process. 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  Above knee right popliteal artery reconstitutes with preserved 3 vessel runoff distally. -occluded left CFA and SFA.  Reconstituted left profunda artery evident with deep femoral collaterals, which eventually reconstitute the above knee left popliteal artery with preserved 3 vessel runoff.   Impression Non Vascular: -hepatic statosis -colonic diverticulosis  -fibroid uterus -no acute intra abdominal or pelvic finding   Pt meds includes: Statin:  Yes.   Beta Blocker:  Yes.   Aspirin:  Yes.   ACEI:  No. ARB:  Yes.   CCB use:  Yes Other Antiplatelet/Anticoagulant:  No   ASSESSMENT/PLAN:: 51 y.o. 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  -pt continues to have lifestyle limiting claudication.  Her CTA revealed bilateral iliac inflow disease with occluded left CFA and SFA arteries with bilateral 3 vessels runoff distally.    Dr. Arbie CookeyEarly has recommended aortobifemoral bypass grafting.  She will need cardiac clearance so we will get that arranged.   -Dr. Arbie CookeyEarly did discuss with the pt the importance of smoking cessation, blood pressure control and healthy diet to help control the progression of her atherosclerosis.  She is given an Rx for Chantix starter pack.    Doreatha MassedSamantha Rhyne, PA-C [facility] [phone]  Clinic MD:  Pt seen and examined with Dr. Arbie CookeyEarly I have examined the patient, reviewed and agree with above. Reviewed CT angiogram with the patient. Have recommended aortobifemoral bypass grafting. Explained that she could potentially be a candidate for left femoral endarterectomy in femorofemoral bypass. I feel that her long-term durability of this would be poor compared to aortofemoral bypass grafting. She understands and wished to proceed as soon as possible. Will obtain cardiac clearance prior to surgery.  Gretta BeganEarly, Todd, MD 01/19/2017 3:44 PM

## 2017-02-08 NOTE — Anesthesia Postprocedure Evaluation (Signed)
Anesthesia Post Note  Patient: Lori Carter  Procedure(s) Performed: Procedure(s) (LRB): AORTA BIFEMORAL BYPASS GRAFT (N/A)     Patient location during evaluation: PACU Anesthesia Type: General Level of consciousness: awake and alert Pain management: pain level controlled Vital Signs Assessment: post-procedure vital signs reviewed and stable Respiratory status: spontaneous breathing, nonlabored ventilation, respiratory function stable and patient connected to nasal cannula oxygen Cardiovascular status: blood pressure returned to baseline and stable Postop Assessment: no signs of nausea or vomiting Anesthetic complications: no    Last Vitals:  Vitals:   02/08/17 1344 02/08/17 1345  BP: 140/83   Pulse: 82   Resp: 14   Temp:  36.9 C    Last Pain:  Vitals:   02/08/17 1345  TempSrc:   PainSc: Asleep                 Thelonious Kauffmann,W. EDMOND

## 2017-02-08 NOTE — Anesthesia Procedure Notes (Signed)
Procedure Name: Intubation Date/Time: 02/08/2017 7:54 AM Performed by: Orlie Dakin Pre-anesthesia Checklist: Patient identified, Emergency Drugs available, Suction available, Patient being monitored and Timeout performed Patient Re-evaluated:Patient Re-evaluated prior to inductionOxygen Delivery Method: Circle system utilized Preoxygenation: Pre-oxygenation with 100% oxygen Intubation Type: IV induction Ventilation: Mask ventilation without difficulty and Oral airway inserted - appropriate to patient size Laryngoscope Size: Mac and 4 Grade View: Grade II Tube type: Oral Tube size: 7.5 mm Number of attempts: 1 Airway Equipment and Method: Stylet Placement Confirmation: ETT inserted through vocal cords under direct vision,  positive ETCO2 and breath sounds checked- equal and bilateral Secured at: 23 cm Tube secured with: Tape Dental Injury: Teeth and Oropharynx as per pre-operative assessment  Comments: Noted chipped right upper front tooth pre-op.  4x4s bite block used.

## 2017-02-09 ENCOUNTER — Inpatient Hospital Stay (HOSPITAL_COMMUNITY): Payer: BLUE CROSS/BLUE SHIELD

## 2017-02-09 ENCOUNTER — Encounter (HOSPITAL_COMMUNITY): Payer: Self-pay | Admitting: Vascular Surgery

## 2017-02-09 LAB — CBC
HCT: 32.6 % — ABNORMAL LOW (ref 36.0–46.0)
HEMOGLOBIN: 10.5 g/dL — AB (ref 12.0–15.0)
MCH: 28.1 pg (ref 26.0–34.0)
MCHC: 32.2 g/dL (ref 30.0–36.0)
MCV: 87.2 fL (ref 78.0–100.0)
PLATELETS: 178 10*3/uL (ref 150–400)
RBC: 3.74 MIL/uL — AB (ref 3.87–5.11)
RDW: 17 % — ABNORMAL HIGH (ref 11.5–15.5)
WBC: 11.3 10*3/uL — AB (ref 4.0–10.5)

## 2017-02-09 LAB — COMPREHENSIVE METABOLIC PANEL
ALK PHOS: 77 U/L (ref 38–126)
ALT: 19 U/L (ref 14–54)
AST: 25 U/L (ref 15–41)
Albumin: 2.9 g/dL — ABNORMAL LOW (ref 3.5–5.0)
Anion gap: 8 (ref 5–15)
BUN: 10 mg/dL (ref 6–20)
CALCIUM: 8.4 mg/dL — AB (ref 8.9–10.3)
CHLORIDE: 101 mmol/L (ref 101–111)
CO2: 26 mmol/L (ref 22–32)
CREATININE: 0.72 mg/dL (ref 0.44–1.00)
GFR calc Af Amer: >60 mL/min (ref >60)
GFR calc non Af Amer: >60 mL/min (ref >60)
Glucose, Bld: 158 mg/dL — ABNORMAL HIGH (ref 65–99)
Potassium: 3.3 mmol/L — ABNORMAL LOW (ref 3.5–5.1)
SODIUM: 135 mmol/L (ref 135–145)
Total Bilirubin: 0.6 mg/dL (ref 0.3–1.2)
Total Protein: 6.1 g/dL — ABNORMAL LOW (ref 6.5–8.1)

## 2017-02-09 LAB — HEMOGLOBIN A1C
HEMOGLOBIN A1C: 6.8 % — AB (ref 4.8–5.6)
MEAN PLASMA GLUCOSE: 148 mg/dL

## 2017-02-09 LAB — POCT I-STAT 3, ART BLOOD GAS (G3+)
Acid-Base Excess: 1 mmol/L (ref 0.0–2.0)
BICARBONATE: 26.3 mmol/L (ref 20.0–28.0)
O2 SAT: 91 %
PCO2 ART: 41.3 mmHg (ref 32.0–48.0)
PO2 ART: 60 mmHg — AB (ref 83.0–108.0)
Patient temperature: 98.2
TCO2: 28 mmol/L (ref 0–100)
pH, Arterial: 7.411 (ref 7.350–7.450)

## 2017-02-09 LAB — GLUCOSE, CAPILLARY
GLUCOSE-CAPILLARY: 169 mg/dL — AB (ref 65–99)
GLUCOSE-CAPILLARY: 159 mg/dL — AB (ref 65–99)
GLUCOSE-CAPILLARY: 155 mg/dL — AB (ref 65–99)
Glucose-Capillary: 173 mg/dL — ABNORMAL HIGH (ref 65–99)
Glucose-Capillary: 148 mg/dL — ABNORMAL HIGH (ref 65–99)

## 2017-02-09 LAB — MAGNESIUM: Magnesium: 1.7 mg/dL (ref 1.7–2.4)

## 2017-02-09 LAB — AMYLASE: Amylase: 26 U/L — ABNORMAL LOW (ref 28–100)

## 2017-02-09 MED ORDER — SODIUM CHLORIDE 0.9% FLUSH
10.0000 mL | Freq: Two times a day (BID) | INTRAVENOUS | Status: DC
  Administered 2017-02-09: 10 mL
  Administered 2017-02-10: 20 mL

## 2017-02-09 MED ORDER — HYDRALAZINE HCL 20 MG/ML IJ SOLN
10.0000 mg | INTRAMUSCULAR | Status: DC | PRN
  Administered 2017-02-09 – 2017-02-10 (×3): 10 mg via INTRAVENOUS
  Filled 2017-02-09 (×3): qty 1

## 2017-02-09 MED ORDER — ORAL CARE MOUTH RINSE
15.0000 mL | Freq: Two times a day (BID) | OROMUCOSAL | Status: DC
  Administered 2017-02-09 – 2017-02-10 (×2): 15 mL via OROMUCOSAL

## 2017-02-09 MED ORDER — SODIUM CHLORIDE 0.9% FLUSH
10.0000 mL | INTRAVENOUS | Status: DC | PRN
  Administered 2017-02-10: 10 mL
  Filled 2017-02-09: qty 40

## 2017-02-09 MED ORDER — SODIUM CHLORIDE 0.9 % IV SOLN
500.0000 mL | Freq: Once | INTRAVENOUS | Status: AC
  Administered 2017-02-09: 500 mL via INTRAVENOUS

## 2017-02-09 MED ORDER — POTASSIUM CHLORIDE 10 MEQ/50ML IV SOLN
10.0000 meq | INTRAVENOUS | Status: AC
  Administered 2017-02-09 (×3): 10 meq via INTRAVENOUS
  Filled 2017-02-09 (×3): qty 50

## 2017-02-09 MED ORDER — CHLORHEXIDINE GLUCONATE CLOTH 2 % EX PADS
6.0000 | MEDICATED_PAD | Freq: Every day | CUTANEOUS | Status: DC
  Administered 2017-02-10: 6 via TOPICAL

## 2017-02-09 NOTE — Care Management Note (Signed)
Case Management Note Donn PieriniKristi Gionna Polak RN, BSN Unit 2W-Case Manager-- 2H coverage [phone]  Patient Details  Name: Lori LaineSonya Deboer MRN: [id number] Date of Birth: 1965/11/21  Subjective/Objective:   Pt admitted s/p AORTA BIFEMORAL BYPASS GRAFT on 02/08/17                 Action/Plan: PTA pt lived at home- PT/OT evals pending- noted plan to d/c NGT today- ice chips only- CM will follow for recommendations per therapies.   Expected Discharge Date:                  Expected Discharge Plan:     In-House Referral:     Discharge planning Services  CM Consult  Post Acute Care Choice:    Choice offered to:     DME Arranged:    DME Agency:     HH Arranged:    HH Agency:     Status of Service:  In process, will continue to follow  If discussed at Long Length of Stay Meetings, dates discussed:    Discharge Disposition:   Additional Comments:  Darrold SpanWebster, Eliane Hammersmith Hall, RN 02/09/2017, 10:17 AM

## 2017-02-09 NOTE — Progress Notes (Signed)
OT Cancellation Note  Patient Details Name: Lori Carter MRN: [id number] DOB: [birth year]   Cancelled Treatment:    Reason Eval/Treat Not Completed: Fatigue/lethargy limiting ability to participate pt returning to bed from chair with PT Lurena Joinerebecca at this time. OT to reattempt at a more appropriate time. RN reports OOB to chair is MD request for today.  Felecia ShellingJones, Chong January B   Aliana Kreischer, Brynn   OTR/L Pager: [phone] Office: [phone] .  02/09/2017, 10:15 AM

## 2017-02-09 NOTE — Progress Notes (Signed)
Fields MD paged to notify of patient's AM potassium of 3.3. Orders received for three 10 mEq runs of potassium. Will carry out orders.   Marlou PorchBradley Ajanee Buren

## 2017-02-09 NOTE — Evaluation (Signed)
Physical Therapy Evaluation Patient Details Name: Lori Carter MRN: [id number] DOB: 1965/12/14 Today's Date: 02/09/2017   History of Present Illness  51 y.o. 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  Pt s/p Aortobifemoral bypass and left common femoral endarterectomy on day of admission.  Pt with significant PMHx of DM2, HTN, diabetic neuropathy, cardiomegaly, anxiety, dyspnea, and L shoulder surgery.  Clinical Impression  Pt is limited by pain, but was able to get up and spend some time in the chair this AM and transfer back to the bed with me with one person assist and RW.  Pt is very anxious and desats on RA.  She reports she did not realize how painful this surgery would be.  She will likely progress well enough to d/c home, but given limited nature of evaluation will likely have to re-assess.   PT to follow acutely for deficits listed below.       Follow Up Recommendations Home health PT;Supervision for mobility/OOB    Equipment Recommendations  Rolling walker with 5" wheels    Recommendations for Other Services   NA    Precautions / Restrictions Precautions Precautions: Other (comment) Precaution Comments: monitor O2 sats with mobility Restrictions Weight Bearing Restrictions: No      Mobility  Bed Mobility Overal bed mobility: Needs Assistance Bed Mobility: Sit to Supine       Sit to supine: Mod assist   General bed mobility comments: Mod assist to help lift bil legs back into the bed from sitting, pt does better with in-bed mobility with air mattress max inflated.   Transfers Overall transfer level: Needs assistance Equipment used: Rolling walker (2 wheeled) Transfers: Sit to/from [employer] CorporationStand;Stand Pivot Transfers Sit to Stand: Mod assist Stand pivot transfers: Min assist       General transfer comment: Mod assist to get to sitting with back off of recliner chair back rest.  Verbal cues for safe hand  placement.  Once up, pt only needed min assist with RW to transfer to bed.   Ambulation/Gait             General Gait Details: NT, RN reports she did not get the impression MD wanted her walking that much yet, and second person would be helpful for gait due to lines and need for portable O2.          Balance Overall balance assessment: Needs assistance Sitting-balance support: Feet supported;Bilateral upper extremity supported Sitting balance-Leahy Scale: Fair Sitting balance - Comments: Pt's sitting balance is limited by pain when she sits upright.  Postural control: Left lateral lean;Posterior lean Standing balance support: Bilateral upper extremity supported Standing balance-Leahy Scale: Poor                               Pertinent Vitals/Pain Pain Assessment: Faces Faces Pain Scale: Hurts worst Pain Location: abdomen Pain Descriptors / Indicators: Aching;Burning;Grimacing;Guarding Pain Intervention(s): Limited activity within patient's tolerance;Monitored during session;Repositioned;Patient requesting pain meds-RN notified    Home Living Family/patient expects to be discharged to:: Private residence                            Hand Dominance   Dominant Hand: Right    Extremity/Trunk Assessment   Upper Extremity Assessment Upper Extremity Assessment: Defer to OT evaluation    Lower Extremity Assessment Lower Extremity Assessment: Generalized weakness (limited by pain)  Cervical / Trunk Assessment Cervical / Trunk Assessment: Normal  Communication   Communication: No difficulties  Cognition Arousal/Alertness: Lethargic;Suspect due to medications Behavior During Therapy: Anxious Overall Cognitive Status: Within Functional Limits for tasks assessed (not specifically tested)                                               Assessment/Plan    PT Assessment Patient needs continued PT services  PT Problem List  Decreased strength;Decreased mobility;Decreased balance;Decreased activity tolerance;Decreased knowledge of use of DME;Decreased knowledge of precautions;Cardiopulmonary status limiting activity;Pain;Obesity       PT Treatment Interventions DME instruction;Gait training;Functional mobility training;Therapeutic activities;Therapeutic exercise;Balance training;Patient/family education    PT Goals (Current goals can be found in the Care Plan section)  Acute Rehab PT Goals Patient Stated Goal: to decrease pain PT Goal Formulation: With patient Time For Goal Achievement: 02/23/17 Potential to Achieve Goals: Good    Frequency Min 3X/week           AM-PAC PT "6 Clicks" Daily Activity  Outcome Measure Difficulty turning over in bed (including adjusting bedclothes, sheets and blankets)?: Total Difficulty moving from lying on back to sitting on the side of the bed? : Total Difficulty sitting down on and standing up from a chair with arms (e.g., wheelchair, bedside commode, etc,.)?: Total Help needed moving to and from a bed to chair (including a wheelchair)?: A Little Help needed walking in hospital room?: A Little Help needed climbing 3-5 steps with a railing? : A Lot 6 Click Score: 11    End of Session Equipment Utilized During Treatment: Oxygen Activity Tolerance: Patient limited by pain;Patient limited by lethargy Patient left: in bed;with call bell/phone within reach;with bed alarm set Nurse Communication: Mobility status;Patient requests pain meds PT Visit Diagnosis: Muscle weakness (generalized) (M62.81);Difficulty in walking, not elsewhere classified (R26.2);Pain;Other (comment) (decreased activity tolerance. ) Pain - part of body:  (abdomen)    Time: 1610-9604 PT Time Calculation (min) (ACUTE ONLY): 24 min   Charges:         Lurena Joiner B. Abyan Cadman, PT, DPT [phone]   PT Evaluation $PT Eval Moderate Complexity: 1 Procedure PT Treatments $Therapeutic Activity: 8-22  mins   02/09/2017, 10:41 AM

## 2017-02-09 NOTE — Progress Notes (Signed)
Patient SBP >170; PRN hydralazine given and ineffective;  ALSO; patient UO has been trending downward; 3725mL/hr x3 hr, no UO since last hour; urine amber/pink tinged;  MD notified;  MD given verbal order to discontinue PRN metoprolol; change hydralazine order to 10mg  q4h IV PRN for SBP>170; also given verbal order for 500mL NS bolus over 1hr.  Will continue to monitor.  Ivery QualeSean Maruice Pieroni RN

## 2017-02-09 NOTE — Progress Notes (Signed)
Subjective: Interval History: none.Marland Kitchen Up in chair. Uncomfortable from abdominal incision. Minimal output NG tube. No nausea or vomiting.  Objective: Vital signs in last 24 hours: Temp:  [97.8 F (36.6 C)-98.5 F (36.9 C)] 98.5 F (36.9 C) (06/12 0300) Pulse Rate:  [78-87] 78 (06/12 0700) Resp:  [14-24] 18 (06/12 0700) BP: (120-170)/(60-94) 162/82 (06/12 0700) SpO2:  [92 %-95 %] 95 % (06/12 0700) Arterial Line BP: (79-189)/(27-85) 95/69 (06/12 0700) FiO2 (%):  [55 %] 55 % (06/12 0045) Weight:  [236 lb 8.9 oz (107.3 kg)-237 lb 7 oz (107.7 kg)] 236 lb 8.9 oz (107.3 kg) (06/12 0600)  Intake/Output from previous day: 06/11 0701 - 06/12 0700 In: 5256.7 [I.V.:4866.7; NG/GT:90; IV Piggyback:300] Out: 1645 [Urine:1395; Emesis/NG output:50; Blood:200] Intake/Output this shift: No intake/output data recorded.  Abdomen soft. Appropriately tender. Unable to assess groin incisions with patient up in chair. Feet warm and well perfused.  Lab Results:  Recent Labs  02/08/17 1300 02/09/17 0332  WBC 9.8 11.3*  HGB 10.3* 10.5*  HCT 32.1* 32.6*  PLT 169 178   BMET  Recent Labs  02/08/17 1300 02/09/17 0332  NA 137 135  K 3.5 3.3*  CL 102 101  CO2 25 26  GLUCOSE 172* 158*  BUN 9 10  CREATININE 0.77 0.72  CALCIUM 8.4* 8.4*    Studies/Results: Dg Chest Port 1 View  Result Date: 02/09/2017 CLINICAL DATA:  Shortness of Breath EXAM: PORTABLE CHEST 1 VIEW COMPARISON:  02/08/2017 FINDINGS: Right jugular central line and nasogastric catheter are again noted in satisfactory position. Cardiac shadow is stable. The overall inspiratory effort is poor without focal infiltrate. No bony abnormality is noted. IMPRESSION: Poor inspiratory effort without acute abnormality. Electronically Signed   By: Alcide Clever M.D.   On: 02/09/2017 07:34   Dg Chest Port 1 View  Result Date: 02/08/2017 CLINICAL DATA:  Status post aortobifemoral bypass graft EXAM: PORTABLE CHEST 1 VIEW COMPARISON:  04/16/2016  FINDINGS: Low volumes. The cardio pericardial silhouette is enlarged. No edema or focal airspace consolidation. The NG tube passes into the stomach although the distal tip position is not included on the film. Right IJ central line tip overlies the distal SVC near the RA junction. IMPRESSION: Low volume film with borderline enlargement cardiopericardial silhouette. Electronically Signed   By: Kennith Center M.D.   On: 02/08/2017 13:01   Dg Abd Portable 1v  Result Date: 02/08/2017 CLINICAL DATA:  51 year old female for aortic bypass. Initial encounter. EXAM: PORTABLE ABDOMEN - 1 VIEW COMPARISON:  01/06/2017 CT. FINDINGS: Nasogastric tube tip left upper quadrant at the expected level of the gastric body. Paucity of bowel gas. Mild degenerative changes lumbar spine. IMPRESSION: Nasogastric tube tip left upper quadrant at the expected level of the gastric body. Paucity of bowel gas. Electronically Signed   By: Lacy Duverney M.D.   On: 02/08/2017 13:02   Anti-infectives: Anti-infectives    Start     Dose/Rate Route Frequency Ordered Stop   02/08/17 2300  cefUROXime (ZINACEF) 1.5 g in dextrose 5 % 50 mL IVPB     1.5 g 100 mL/hr over 30 Minutes Intravenous Every 12 hours 02/08/17 1421 02/09/17 2259   02/08/17 1215  cefUROXime (ZINACEF) 1.5 g in dextrose 5 % 50 mL IVPB  Status:  Discontinued     1.5 g 100 mL/hr over 30 Minutes Intravenous To Surgery 02/08/17 1208 02/08/17 1409   02/08/17 0615  cefUROXime (ZINACEF) 1.5 g in dextrose 5 % 50 mL IVPB     1.5  g 100 mL/hr over 30 Minutes Intravenous 30 min pre-op 02/08/17 0615 02/08/17 1205      Assessment/Plan: s/p Procedure(s): AORTA BIFEMORAL BYPASS GRAFT (N/A) Stable overall. Requiring 6 L nasal cannula to keep oxygen saturation above 90%. Dressing importance of immobilization and incentive spirometry. Will DC NG tube. Ice chips only. Will keep in ICU.   LOS: 1 day   Lori Carter 02/09/2017, 8:09 AM

## 2017-02-09 NOTE — Plan of Care (Signed)
Problem: Pain Managment: Goal: General experience of comfort will improve Outcome: Progressing Patient is able to sleep comfortably  Problem: Tissue Perfusion: Goal: Risk factors for ineffective tissue perfusion will decrease Outcome: Progressing Palpable bilateral lower extremity pulses  Problem: Activity: Goal: Risk for activity intolerance will decrease Outcome: Progressing Chair today  Problem: Nutrition: Goal: Adequate nutrition will be maintained Outcome: Progressing Ice chips; no nausea  Problem: Bowel/Gastric: Goal: Gastrointestinal status for postoperative course will improve Outcome: Progressing Audible hypoactive bowels

## 2017-02-10 LAB — GLUCOSE, CAPILLARY
GLUCOSE-CAPILLARY: 129 mg/dL — AB (ref 65–99)
GLUCOSE-CAPILLARY: 135 mg/dL — AB (ref 65–99)
GLUCOSE-CAPILLARY: 137 mg/dL — AB (ref 65–99)
GLUCOSE-CAPILLARY: 135 mg/dL — AB (ref 65–99)
Glucose-Capillary: 134 mg/dL — ABNORMAL HIGH (ref 65–99)
Glucose-Capillary: 166 mg/dL — ABNORMAL HIGH (ref 65–99)

## 2017-02-10 MED ORDER — METOPROLOL TARTRATE 5 MG/5ML IV SOLN
5.0000 mg | Freq: Four times a day (QID) | INTRAVENOUS | Status: DC
  Administered 2017-02-10 – 2017-02-12 (×8): 5 mg via INTRAVENOUS
  Filled 2017-02-10 (×8): qty 5

## 2017-02-10 NOTE — Evaluation (Signed)
Occupational Therapy Evaluation Patient Details Name: Lori Carter MRN: [id number] DOB: 25-Mar-1966 Today's Date: 02/10/2017    History of Present Illness 51 y.o. female [facility] for Lower extremity arterial insufficiency with limiting claudication.  Pt s/p Aortobifemoral bypass and left common femoral endarterectomy on day of admission.  Pt with significant PMHx of DM2, HTN, diabetic neuropathy, cardiomegaly, anxiety, dyspnea, and L shoulder surgery.   Clinical Impression   Patient is s/p aortobifemoral bypass and L common femoral endarterectomy surgery resulting in functional limitations due to the deficits listed below (see OT problem list). PTA was independent with all adls. Pt currently dependent for below knee ADLS and peri hygiene.  Patient will benefit from skilled OT acutely to increase independence and safety with ADLS to allow discharge HHOT.      Follow Up Recommendations  Home health OT;Supervision - Intermittent    Equipment Recommendations  3 in 1 bedside commode    Recommendations for Other Services       Precautions / Restrictions Precautions Precaution Comments: monitor O2 sats with mobility      Mobility Bed Mobility               General bed mobility comments: in chair on arrival  Transfers Overall transfer level: Needs assistance   Transfers: Sit to/from Stand Sit to Stand: Min assist         General transfer comment: pt requires use of bil UE and (A) from therapist to power up into standing    Balance Overall balance assessment: Needs assistance                                         ADL either performed or assessed with clinical judgement   ADL Overall ADL's : Needs assistance/impaired Eating/Feeding: Set up   Grooming: Set up;Sitting   Upper Body Bathing: Moderate assistance   Lower Body Bathing: Maximal assistance           Toilet Transfer: Minimal assistance Toilet Transfer Details  (indicate cue type and reason): requires elevated surface and place to push with bil UE         Functional mobility during ADLs: Min guard General ADL Comments: pt using a Carley Hammedva walker this session with Bil Ue support and oxygen tank     Vision Baseline Vision/History: Wears glasses Wears Glasses:  (lost glasses per family and does not use them as prescribed)       Perception     Praxis      Pertinent Vitals/Pain Pain Assessment: Faces Faces Pain Scale: Hurts whole lot Pain Location: abdomen Pain Descriptors / Indicators: Sore Pain Intervention(s): Monitored during session;Repositioned;Premedicated before session;Limited activity within patient's tolerance     Hand Dominance Right   Extremity/Trunk Assessment Upper Extremity Assessment Upper Extremity Assessment: Overall WFL for tasks assessed   Lower Extremity Assessment Lower Extremity Assessment: Defer to PT evaluation   Cervical / Trunk Assessment Cervical / Trunk Assessment: Normal   Communication Communication Communication: No difficulties   Cognition Arousal/Alertness: Awake/alert Behavior During Therapy: WFL for tasks assessed/performed Overall Cognitive Status: Within Functional Limits for tasks assessed                                     General Comments       Exercises  Shoulder Instructions      Home Living Family/patient expects to be discharged to:: Private residence Living Arrangements: Non-relatives/Friends Available Help at Discharge: Friend(s);Family;Available PRN/intermittently (friend works til Haematologist and daughter works til [employer]) Type of Home: Mobile home Home Access: Stairs to enter Secretary/administrator of Steps: 4 Entrance Stairs-Rails: Left Home Layout: One level     Bathroom Shower/Tub: Producer, television/film/video: Standard     Home Equipment: None          Prior Functioning/Environment                   OT Problem List: Decreased  strength;Decreased activity tolerance;Impaired balance (sitting and/or standing);Decreased safety awareness;Decreased knowledge of use of DME or AE;Decreased knowledge of precautions;Cardiopulmonary status limiting activity;Obesity;Pain      OT Treatment/Interventions: Self-care/ADL training;Therapeutic exercise;Neuromuscular education;Energy conservation;DME and/or AE instruction;Therapeutic activities;Patient/family education;Balance training    OT Goals(Current goals can be found in the care plan section) Acute Rehab OT Goals Patient Stated Goal: to decrease pain OT Goal Formulation: With patient Time For Goal Achievement: 02/24/17 Potential to Achieve Goals: Good  OT Frequency: Min 3X/week   Barriers to D/C:            Co-evaluation              AM-PAC PT "6 Clicks" Daily Activity     Outcome Measure Help from another person eating meals?: A Little Help from another person taking care of personal grooming?: A Little Help from another person toileting, which includes using toliet, bedpan, or urinal?: A Lot Help from another person bathing (including washing, rinsing, drying)?: A Lot Help from another person to put on and taking off regular upper body clothing?: A Lot Help from another person to put on and taking off regular lower body clothing?: A Lot 6 Click Score: 14   End of Session Equipment Utilized During Treatment: Gait belt;Rolling walker;Oxygen Nurse Communication: Mobility status;Precautions  Activity Tolerance: Patient tolerated treatment well Patient left: in chair;with call bell/phone within reach;with nursing/sitter in room  OT Visit Diagnosis: Unsteadiness on feet (R26.81)                Time: 1610-9604 (5409-8119) OT Time Calculation (min): 32 min Charges:  OT General Charges $OT Visit: 1 Procedure OT Evaluation $OT Eval High Complexity: 1 Procedure OT Treatments $Self Care/Home Management : 8-22 mins G-Codes:      Mateo Flow   OTR/L Pager:  [phone] Office: [phone] .   Boone Master B 02/10/2017, 2:26 PM

## 2017-02-10 NOTE — Progress Notes (Signed)
Patient had one urine occurrence earlier. Did not wait for assistance and tried to go to the bathroom and urinated in the floor. Bedside commode placed and patient advised not to get up again without assistance. Daughter present at bedside.

## 2017-02-10 NOTE — Progress Notes (Addendum)
[facility]   Subjective  - Ambulated this am.  No N/V.   Objective (!) 176/81 (!) 102 98.8 F (37.1 C) (Oral) (!) 29 94%  Intake/Output Summary (Last 24 hours) at 02/10/17 0717 Last data filed at 02/10/17 0400  Gross per 24 hour  Intake             2795 ml  Output              770 ml  Net             2025 ml    Palpable DP 2+ B Abdominal binder for comfort, groin incisions soft dry 4 x 4 in place No BS to auscultation, abdomin soft   Heart RRR, tachy 108 Lungs encourage IS UO increased after bolus   Assessment/Planning: POD # 2 This is a 51 y.o. female who is s/p aortobifemoral bypass grafting.  Nursing care note: Patient SBP >170; PRN hydralazine given and ineffective;  ALSO; patient UO has been trending downward; 5625mL/hr x3 hr, no UO since last hour; urine amber/pink tinged;  MD notified;  MD given verbal order to discontinue PRN metoprolol; change hydralazine order to 10mg  q4h IV PRN for SBP>170; also given verbal order for 500mL NS bolus over 1hr.  Increased Metoprolol to 5 mg Q 6, patient on Coreg normally PO 25 mg BID at home. Hydralazine PRN for HTN Continue IV fluids D5 1/2 NACL today, cont. to monitor UO. No BS will keep NPO for now Dry guaze to B groins Possible transfer to 4E today pending discussion with Dr. Fayne MediateEarly  COLLINS, EMMA [facility] 02/10/2017 7:17 AM --  Laboratory Lab Results:  Recent Labs  02/08/17 1300 02/09/17 0332  WBC 9.8 11.3*  HGB 10.3* 10.5*  HCT 32.1* 32.6*  PLT 169 178   BMET  Recent Labs  02/08/17 1300 02/09/17 0332  NA 137 135  K 3.5 3.3*  CL 102 101  CO2 25 26  GLUCOSE 172* 158*  BUN 9 10  CREATININE 0.77 0.72  CALCIUM 8.4* 8.4*    COAG Lab Results  Component Value Date   INR 1.17 02/08/2017   INR 1.02 02/02/2017   No results found for: PTT  I have examined the patient, reviewed and agree with above.Comfortable. Is ambulated several more times this afternoon. No nausea or  vomiting. Will DC Foley and central line. Transfer to 2 W. began clear liquids.  Gretta BeganEarly, Todd, MD 02/10/2017 2:16 PM

## 2017-02-11 LAB — GLUCOSE, CAPILLARY
GLUCOSE-CAPILLARY: 129 mg/dL — AB (ref 65–99)
GLUCOSE-CAPILLARY: 108 mg/dL — AB (ref 65–99)
Glucose-Capillary: 122 mg/dL — ABNORMAL HIGH (ref 65–99)
Glucose-Capillary: 133 mg/dL — ABNORMAL HIGH (ref 65–99)
Glucose-Capillary: 136 mg/dL — ABNORMAL HIGH (ref 65–99)
Glucose-Capillary: 146 mg/dL — ABNORMAL HIGH (ref 65–99)

## 2017-02-11 MED ORDER — BISACODYL 10 MG RE SUPP
10.0000 mg | Freq: Once | RECTAL | Status: DC
  Filled 2017-02-11: qty 1

## 2017-02-11 NOTE — Progress Notes (Signed)
Subjective: Interval History: none.Marland Kitchen Up in chair this morning ambulated without difficulty yesterday. No nausea or vomiting. Reports that she was not given liquid diet that was written for yesterday. Has not had any gas or bowel movement.  Objective: Vital signs in last 24 hours: Temp:  [97.6 F (36.4 C)-98.7 F (37.1 C)] 98.6 F (37 C) (06/14 0419) Pulse Rate:  [87-116] 87 (06/14 0419) Resp:  [18-38] 18 (06/14 0419) BP: (140-188)/(72-95) 150/75 (06/14 0419) SpO2:  [93 %-100 %] 98 % (06/14 0419)  Intake/Output from previous day: 06/13 0701 - 06/14 0700 In: 750 [I.V.:750] Out: 1650 [Urine:1650] Intake/Output this shift: Total I/O In: -  Out: 900 [Urine:900]  Abdomen soft and nontender. Lower extremity is warm and well perfused.  Lab Results:  Recent Labs  02/08/17 1300 02/09/17 0332  WBC 9.8 11.3*  HGB 10.3* 10.5*  HCT 32.1* 32.6*  PLT 169 178   BMET  Recent Labs  02/08/17 1300 02/09/17 0332  NA 137 135  K 3.5 3.3*  CL 102 101  CO2 25 26  GLUCOSE 172* 158*  BUN 9 10  CREATININE 0.77 0.72  CALCIUM 8.4* 8.4*    Studies/Results: Dg Chest Port 1 View  Result Date: 02/09/2017 CLINICAL DATA:  Shortness of Breath EXAM: PORTABLE CHEST 1 VIEW COMPARISON:  02/08/2017 FINDINGS: Right jugular central line and nasogastric catheter are again noted in satisfactory position. Cardiac shadow is stable. The overall inspiratory effort is poor without focal infiltrate. No bony abnormality is noted. IMPRESSION: Poor inspiratory effort without acute abnormality. Electronically Signed   By: Alcide Clever M.D.   On: 02/09/2017 07:34   Dg Chest Port 1 View  Result Date: 02/08/2017 CLINICAL DATA:  Status post aortobifemoral bypass graft EXAM: PORTABLE CHEST 1 VIEW COMPARISON:  04/16/2016 FINDINGS: Low volumes. The cardio pericardial silhouette is enlarged. No edema or focal airspace consolidation. The NG tube passes into the stomach although the distal tip position is not included on  the film. Right IJ central line tip overlies the distal SVC near the RA junction. IMPRESSION: Low volume film with borderline enlargement cardiopericardial silhouette. Electronically Signed   By: Kennith Center M.D.   On: 02/08/2017 13:01   Dg Abd Portable 1v  Result Date: 02/08/2017 CLINICAL DATA:  51 year old female for aortic bypass. Initial encounter. EXAM: PORTABLE ABDOMEN - 1 VIEW COMPARISON:  01/06/2017 CT. FINDINGS: Nasogastric tube tip left upper quadrant at the expected level of the gastric body. Paucity of bowel gas. Mild degenerative changes lumbar spine. IMPRESSION: Nasogastric tube tip left upper quadrant at the expected level of the gastric body. Paucity of bowel gas. Electronically Signed   By: Lacy Duverney M.D.   On: 02/08/2017 13:02   Anti-infectives: Anti-infectives    Start     Dose/Rate Route Frequency Ordered Stop   02/08/17 2300  cefUROXime (ZINACEF) 1.5 g in dextrose 5 % 50 mL IVPB     1.5 g 100 mL/hr over 30 Minutes Intravenous Every 12 hours 02/08/17 1421 02/09/17 1150   02/08/17 1215  cefUROXime (ZINACEF) 1.5 g in dextrose 5 % 50 mL IVPB  Status:  Discontinued     1.5 g 100 mL/hr over 30 Minutes Intravenous To Surgery 02/08/17 1208 02/08/17 1409   02/08/17 0615  cefUROXime (ZINACEF) 1.5 g in dextrose 5 % 50 mL IVPB     1.5 g 100 mL/hr over 30 Minutes Intravenous 30 min pre-op 02/08/17 0615 02/08/17 1205      Assessment/Plan: s/p Procedure(s): AORTA BIFEMORAL BYPASS GRAFT (N/A) Stable  overall. Liquid diet this morning and advance today as tolerated. Will give rectal suppository today to help stimulate bowel movement.   LOS: 3 days   Gretta Beganarly, Trinadee Verhagen 02/11/2017, 6:57 AM

## 2017-02-11 NOTE — Progress Notes (Signed)
Pt refused suppository for bowels.  Pt had been passing gas and had small BM. Advanced patients diet to heart healthy/carb modified per patient request and MD note.  Patient is pain free and tolerating diet.  Patient ambulated with PT and wants to make sure has equipment needed for discharge. Pt resting with call bell within reach.  Will continue to monitor. Lori Carter, Lilly Gasser McClintock, RN

## 2017-02-11 NOTE — Progress Notes (Signed)
Physical Therapy Treatment Patient Details Name: Lori Carter MRN: [id number] DOB: 12-04-1965 Today's Date: 02/11/2017    History of Present Illness 51 [facility].o. [facility] for Lower extremity arterial insufficiency with limiting claudication.  Pt s/p Aortobifemoral bypass and left common femoral endarterectomy on day of admission.  Pt with significant PMHx of DM2, HTN, diabetic neuropathy, cardiomegaly, anxiety, dyspnea, and L shoulder surgery.    PT Comments    Patient is progressing well toward mobility goals. Overall supervision/min guard for safety. Pt tolerated increased gait distance. SpO2 90% or > on RA throughout session. Current plan remains appropriate.    Follow Up Recommendations  Home health PT;Supervision for mobility/OOB     Equipment Recommendations  Rolling walker with 5" wheels    Recommendations for Other Services       Precautions / Restrictions      Mobility  Bed Mobility               General bed mobility comments: pt sitting EOB upon arrival; discussed log roll technique for lying back down  Transfers Overall transfer level: Needs assistance Equipment used: Rolling walker (2 wheeled) Transfers: Sit to/from Stand Sit to Stand: Min guard         General transfer comment: min guard for safety; cues for hand placement  Ambulation/Gait Ambulation/Gait assistance: Supervision Ambulation Distance (Feet): 150 Feet Assistive device: Rolling walker (2 wheeled) Gait Pattern/deviations: Step-through pattern;Decreased stride length;Trunk flexed Gait velocity: decreased   General Gait Details: cues for posture; RW adjusted    Stairs            Wheelchair Mobility    Modified Rankin (Stroke Patients Only)       Balance Overall balance assessment: Needs assistance Sitting-balance support: Feet supported Sitting balance-Leahy Scale: Good     Standing balance support: Single extremity supported Standing  balance-Leahy Scale: Fair                              Cognition Arousal/Alertness: Awake/alert Behavior During Therapy: WFL for tasks assessed/performed Overall Cognitive Status: Within Functional Limits for tasks assessed                                        Exercises      General Comments General comments (skin integrity, edema, etc.): Vitals WNL; SpO2 90% or > throughout session on RA; pt encouraged to continue use of incentive spirometer      Pertinent Vitals/Pain Pain Assessment: Faces Faces Pain Scale: Hurts little more Pain Location: abdomen Pain Descriptors / Indicators: Aching;Grimacing;Guarding Pain Intervention(s): Monitored during session;Repositioned    Home Living                      Prior Function            PT Goals (current goals can now be found in the care plan section) Acute Rehab PT Goals Patient Stated Goal: to decrease pain PT Goal Formulation: With patient Time For Goal Achievement: 02/23/17 Potential to Achieve Goals: Good Progress towards PT goals: Progressing toward goals    Frequency    Min 3X/week      PT Plan Current plan remains appropriate    Co-evaluation              AM-PAC PT "6 Clicks" Daily Activity  Outcome  Measure  Difficulty turning over in bed (including adjusting bedclothes, sheets and blankets)?: A Lot Difficulty moving from lying on back to sitting on the side of the bed? : Total Difficulty sitting down on and standing up from a chair with arms (e.g., wheelchair, bedside commode, etc,.)?: A Little Help needed moving to and from a bed to chair (including a wheelchair)?: A Little Help needed walking in hospital room?: A Little Help needed climbing 3-5 steps with a railing? : A Little 6 Click Score: 15    End of Session Equipment Utilized During Treatment: Gait belt Activity Tolerance: Patient tolerated treatment well Patient left: in bed;with call bell/phone within  reach;with family/visitor present (sitting EOB with daughter present) Nurse Communication: Mobility status;Patient requests pain meds PT Visit Diagnosis: Muscle weakness (generalized) (M62.81);Difficulty in walking, not elsewhere classified (R26.2);Pain;Other (comment) (decreased activity tolerance. ) Pain - part of body:  (abdomen)     Time: 1610-9604 PT Time Calculation (min) (ACUTE ONLY): 25 min  Charges:  $Gait Training: 8-22 mins $Therapeutic Activity: 8-22 mins                    G Codes:       Erline Levine, PTA Pager: [phone]     Carolynne Edouard 02/11/2017, 10:39 AM

## 2017-02-12 ENCOUNTER — Encounter (HOSPITAL_COMMUNITY): Payer: Self-pay

## 2017-02-12 LAB — GLUCOSE, CAPILLARY
Glucose-Capillary: 105 mg/dL — ABNORMAL HIGH (ref 65–99)
Glucose-Capillary: 116 mg/dL — ABNORMAL HIGH (ref 65–99)
Glucose-Capillary: 121 mg/dL — ABNORMAL HIGH (ref 65–99)

## 2017-02-12 MED ORDER — PANTOPRAZOLE SODIUM 40 MG PO TBEC
40.0000 mg | DELAYED_RELEASE_TABLET | Freq: Every day | ORAL | Status: DC
  Filled 2017-02-12: qty 1

## 2017-02-12 MED ORDER — OXYCODONE-ACETAMINOPHEN 5-325 MG PO TABS: 1.0000 | ORAL_TABLET | ORAL | 0 refills | Status: DC | PRN

## 2017-02-12 MED ORDER — OXYCODONE-ACETAMINOPHEN 5-325 MG PO TABS: 1.0000 | ORAL_TABLET | ORAL | Status: DC | PRN

## 2017-02-12 NOTE — Progress Notes (Signed)
Pt was given discharge instructions, follow up appointments and prescriptions.  Pt had no further questions at this time.

## 2017-02-12 NOTE — Progress Notes (Signed)
Occupational Therapy Treatment Patient Details Name: Lori Carter MRN: [id number] DOB: [birth year] Today's Date: 02/12/2017    History of present illness 51 y.o. female admitted to [facility] on 02/08/17 for Lower extremity arterial insufficiency with limiting claudication.  Pt s/p Aortobifemoral bypass and left common femoral endarterectomy on day of admission.  Pt with significant PMHx of DM2, HTN, diabetic neuropathy, cardiomegaly, anxiety, dyspnea, and L shoulder surgery.   OT comments  Pt making good progress with functional goals. Pt instructed on ADL A/E for LB ADLs. Patient provided with ADL A/E kit for home use and is scheduled to d/c home today  Follow Up Recommendations  Home health OT;Supervision - Intermittent    Equipment Recommendations  3 in 1 bedside commode;Other (comment) (ADL A/E for LB)    Recommendations for Other Services      Precautions / Restrictions Precautions Required Braces or Orthoses: Other Brace/Splint Other Brace/Splint: abdominal ninder for comfort Restrictions Weight Bearing Restrictions: No       Mobility Bed Mobility               General bed mobility comments: pt up in recliner upon arrival  Transfers Overall transfer level: Needs assistance Equipment used: Rolling walker (2 wheeled) Transfers: Sit to/from Stand Sit to Stand: Min guard         General transfer comment: min guard for safety; cues for hand placement    Balance Overall balance assessment: Needs assistance Sitting-balance support: Feet supported Sitting balance-Leahy Scale: Good     Standing balance support: Single extremity supported;No upper extremity supported;During functional activity Standing balance-Leahy Scale: Fair                             ADL either performed or assessed with clinical judgement   ADL Overall ADL's : Needs assistance/impaired     Grooming: Wash/dry hands;Standing;Wash/dry face;Supervision/safety       Lower Body  Bathing: Moderate assistance Lower Body Bathing Details (indicate cue type and reason): simulated with A/E     Lower Body Dressing: Moderate assistance Lower Body Dressing Details (indicate cue type and reason): simulated with A/E     Toileting- Clothing Manipulation and Hygiene: Supervision/safety   Tub/ Shower Transfer: Min guard;Grab bars;Rolling walker   Functional mobility during ADLs: Supervision/safety General ADL Comments: pt inmstructed on use of ADL A/E for LB ADLs, provided with ADL kit     Vision Baseline Vision/History: Wears glasses                Cognition Arousal/Alertness: Awake/alert Behavior During Therapy: WFL for tasks assessed/performed Overall Cognitive Status: Within Functional Limits for tasks assessed                                                      General Comments  pt pleasant and cooperative    Pertinent Vitals/ Pain       Pain Assessment: 0-10 Pain Score: 5  Pain Location: abdomen Pain Descriptors / Indicators: Guarding;Sore Pain Intervention(s): Monitored during session;Repositioned  Home Living                                          Prior Functioning/Environment  Frequency  Min 3X/week        Progress Toward Goals  OT Goals(current goals can now be found in the care plan section)  Progress towards OT goals: Progressing toward goals  Acute Rehab OT Goals Patient Stated Goal: go home  Plan Discharge plan remains appropriate                     AM-PAC PT "6 Clicks" Daily Activity     Outcome Measure   Help from another person eating meals?: None Help from another person taking care of personal grooming?: A Little Help from another person toileting, which includes using toliet, bedpan, or urinal?: None Help from another person bathing (including washing, rinsing, drying)?: A Little Help from another person to put on and taking off regular upper body  clothing?: A Little Help from another person to put on and taking off regular lower body clothing?: A Little 6 Click Score: 20    End of Session Equipment Utilized During Treatment: Gait belt;Rolling walker  OT Visit Diagnosis: Unsteadiness on feet (R26.81)   Activity Tolerance Patient tolerated treatment well   Patient Left in chair;with call bell/phone within reach   Nurse Communication      Functional Assessment Tool Used: AM-PAC 6 Clicks Daily Activity   Time: 3335-4562 OT Time Calculation (min): 29 min  Charges: OT G-codes **NOT FOR INPATIENT CLASS** Functional Assessment Tool Used: AM-PAC 6 Clicks Daily Activity OT General Charges $OT Visit: 1 Procedure OT Treatments $Self Care/Home Management : 8-22 mins $Therapeutic Activity: 8-22 mins     Britt Bottom 02/12/2017, 12:37 PM

## 2017-02-12 NOTE — Progress Notes (Signed)
[facility] [place]  Subjective  - Walking with walker independent, tolerated PO's and had BM.   Objective (!) 146/73 75 98.1 F (36.7 C) (Oral) 18 97%  Intake/Output Summary (Last 24 hours) at 02/12/17 0724 Last data filed at 02/11/17 1811  Gross per 24 hour  Intake              600 ml  Output              500 ml  Net              100 ml    Abdomin soft, incision healing well Groins healing well, dry 4 x 4 placed in groins B Feet warm and well perfused Heart RRR Lungs non labored breathing  Assessment/Planning: POD # 4 AORTA BIFEMORAL BYPASS GRAFT Abdominal binder PRN for comfort, dry 4 x 4 to B groins Will order rolling walker for home, HH PT BP stable, UO good Discharge per Dr. Fayne MediateEarly   Nick Stults [facility] 02/12/2017 7:24 AM --  Laboratory Lab Results: No results for input(s): WBC, HGB, HCT, PLT in the last 72 hours. BMET No results for input(s): NA, K, CL, CO2, GLUCOSE, BUN, CREATININE, CALCIUM in the last 72 hours.  COAG Lab Results  Component Value Date   INR 1.17 02/08/2017   INR 1.02 02/02/2017   No results found for: PTT

## 2017-02-12 NOTE — Care Management Note (Signed)
Case Management Note Donn PieriniKristi Henna Derderian RN, BSN Unit 2W-Case Manager-- 2H coverage [phone]  Patient Details  Name: Lori LaineSonya Lease MRN: [id number] Date of Birth: 21-Sep-1965  Subjective/Objective:   Pt admitted s/p AORTA BIFEMORAL BYPASS GRAFT on 02/08/17                 Action/Plan: PTA pt lived at home- PT/OT evals pending- noted plan to d/c NGT today- ice chips only- CM will follow for recommendations per therapies.   Expected Discharge Date:     02/12/17             Expected Discharge Plan:  Home w Home Health Services  In-House Referral:     Discharge planning Services  CM Consult  Post Acute Care Choice:  Home Health, Durable Medical Equipment Choice offered to:  Patient  DME Arranged:  3-N-1, Walker rolling DME Agency:  [facility]  [facility]  [facility]  Status of Service:  Completed, signed off  If discussed at Long Length of Stay Meetings, dates discussed:    Discharge Disposition: home with home health   Additional Comments:  02/12/17- 1000- Donn PieriniKristi Margaretmary Prisk RN, CM- pt for d/c today- orders have been placed for HHPT/OT and DME-RW and 3n1- spoke with pt at bedside- [facility] [place]- [facility]- call made to agency- and referral accepted- services to begin 6/18- faxed referral and orders to [phone]- call also made to [facility]Karen [facility]- RW and 3n1 to be delivered to room prior to discharge.   Darrold SpanWebster, Jerusalen Mateja Hall, RN 02/12/2017, 10:18 AM

## 2017-02-15 NOTE — Discharge Summary (Signed)
[facility] Discharge Summary   Patient ID:  Lori Carter MRN: [id number] DOB/AGE: Sep 06, 1965 51 y.o.  Admit date: 02/08/2017 Discharge date: 02/12/2017 Date of Surgery: 02/08/2017 Surgeon: Surgeon(s): Early, Kristen Loader, MD Chuck Hint, MD  Admission Diagnosis: Aortoiliac Occlusive Disease  I74.09  Discharge Diagnoses:  Aortoiliac Occlusive Disease  I74.09  Secondary Diagnoses: Past Medical History:  Diagnosis Date  . Acne   . Anxiety   . Atopic neurodermatitis   . Cardiomegaly   . Cellulitis of toe of left foot    acute  . Complication of anesthesia    woke up during surgery one   . COPD (chronic obstructive pulmonary disease) (HCC)   . Diabetic neuropathy (HCC)   . Dyspnea    with exertion  . Dysthymic disorder   . Epidermal inclusion cyst   . GERD (gastroesophageal reflux disease)    not taking anything  . Heart murmur    told years ago- , no one has said anything in years  . Hyperlipidemia   . Hypertension, benign    chronic  . Obstructive sleep apnea   . Type 2 diabetes mellitus without complication (HCC)    chronic    Procedure(s): AORTA BIFEMORAL BYPASS GRAFT  Discharged Condition: good  HPI: This is a 51 y.o. female who was seen for evaluation of lower external ureter insufficiency by Dr. Arbie Cookey on 12/22/16. She works as a Clinical biochemist and has had progressive changes over the last several months of inability to walk any distance without pain. She reports that this is a 18 cramping sensation that begins in her calves and can extend into her thighs. She reports makes it difficult for her to work having to stop multiple times. She denies any tissue loss with specifically no ulcerations on her feet. She does have a history in the past approximately 10 years ago with a 7 day admission for possible cardiac issues but has never been told specifically that she had a myocardial infarction. She does have a family history of cardiac and peripheral  vascular occlusive disease. Does have a long history of cigarette smoking.  Non-Invasive Vascular Imaging:   [facility].4 [facility]  CT Angiogram abdomen/pelvis with runoff 01/05/17: Impression Vascular: -Aortic atherosclerosis without aneurysm or occlusive process. 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  Above knee right popliteal artery reconstitutes with preserved 3 vessel runoff distally. -occluded left CFA and SFA.  Reconstituted left profunda artery evident with deep femoral collaterals, which eventually reconstitute the above knee left popliteal artery with preserved 3 vessel runoff.  Hospital Course:  Imberly Troxler is a 51 y.o. female is S/P  Procedure(s): AORTA BIFEMORAL BYPASS GRAFT POD# 2 Nursing care note: Patient SBP >170; PRN hydralazine given and ineffective; ALSO; patient UO has been trending downward; 30mL/hr x3 hr, no UO since last hour; urine amber/pink tinged; MD notified; MD given verbal order to discontinue PRN metoprolol; change hydralazine order to 10mg  q4h IV PRN for SBP>170; also given verbal order for NS bolus over 1hr. Once ambulating and tolerating clear liquids, she progress to a full diet over the next few days.   B feet well perfused, all incisions healing well and bowel functioning normally.   Discharged home in stable condition.   HH , rolling walker and 3 in 1.     Significant Diagnostic Studies: CBC Lab Results  Component Value Date   WBC 11.3 (H) 02/09/2017   HGB 10.5 (L) 02/09/2017   HCT 32.6 (L) 02/09/2017   MCV 87.2 02/09/2017   PLT 178 02/09/2017    BMET    Component Value Date/Time   NA 135 02/09/2017 0332   K 3.3 (L) 02/09/2017 0332   CL 101 02/09/2017 0332   CO2 26 02/09/2017 0332   GLUCOSE 158 (H) 02/09/2017 0332   BUN 10 02/09/2017 0332   CREATININE 0.72  02/09/2017 0332   CALCIUM 8.4 (L) 02/09/2017 0332   GFRNONAA >60 02/09/2017 0332   GFRAA >60 02/09/2017 0332   COAG Lab Results  Component Value Date   INR 1.17 02/08/2017   INR 1.02 02/02/2017     Disposition:  Discharge to :Home Discharge Instructions    Call MD for:  redness, tenderness, or signs of infection (pain, swelling, bleeding, redness, odor or green/yellow discharge around incision site)    Complete by:  As directed    Call MD for:  severe or increased pain, loss or decreased feeling  in affected limb(s)    Complete by:  As directed    Call MD for:  temperature >100.5    Complete by:  As directed    Discharge instructions    Complete by:  As directed    You may shower daily dry the groin incisions well and keep 4 x 4 over the incisions while at rest.   Driving Restrictions    Complete by:  As directed    No driving for 2-3 weeks   Increase activity slowly    Complete by:  As directed    Walk with assistance use walker or cane as needed   Lifting restrictions    Complete by:  As directed    No lifting for 3-4 weeks   Resume previous diet    Complete by:  As directed      Allergies as of 02/12/2017      Reactions   No Known Allergies       Medication List    TAKE these medications   albuterol 108 (90 Base) MCG/ACT inhaler Commonly known as:  PROVENTIL HFA;VENTOLIN HFA Inhale 2 puffs into the lungs every 6 (six) hours as needed for wheezing or shortness of breath.   aspirin EC 81 MG tablet Take 162 mg by mouth daily.   atorvastatin 10 MG tablet Commonly known as:  LIPITOR Take 10 mg by mouth daily.   carvedilol 25 MG tablet Commonly known as:  COREG Take 25 mg by mouth 2 (two) times daily with a meal.   citalopram 20 MG tablet Commonly known as:  CELEXA Take 20 mg by mouth daily.   gabapentin 300 MG capsule Commonly known as:  NEURONTIN Take 300 mg by mouth 3 (three) times daily.   GOLD BOND EX Apply 1 application topically as needed (for  skin).   hydrochlorothiazide 25 MG tablet Commonly known as:  HYDRODIURIL Take 25 mg by mouth daily.   metFORMIN 500 MG tablet Commonly known as:  GLUCOPHAGE Take 500 mg by mouth 2 (two) times daily with a meal.   oxyCODONE-acetaminophen 5-325 MG tablet Commonly known as:  PERCOCET/ROXICET Take 1 tablet by mouth every 4 (four) hours as needed for moderate pain.   TRIBENZOR 40-10-25 MG Tabs Generic drug:  Olmesartan-Amlodipine-HCTZ Take 1 tablet by mouth daily.   varenicline 0.5 MG X 11 & 1 MG X 42 tablet Commonly known as:  CHANTIX STARTING MONTH PAK Take one  0.5 mg tablet by mouth once daily for 3 days, then increase to one 0.5 mg tablet twice daily for 4 days, then increase to one 1 mg tablet twice daily.      Verbal and written Discharge instructions given to the patient. Wound care per Discharge AVS Follow-up Information    [facility]   Specialty:  Home Health Services Why:  HHPT/OT arranged- please allow 24-48 hr for them to contact you to set up home service- may be 6/18 for start of care-  Contact information: [address] [place] [address] [phone]        [facility] - Dme Follow up.   Why:  RW and 3n1 arranged- to be delivered to room prior to discharge.  Contact information: [address] [place] [address] [phone]           Signed: Clinton GallantCOLLINS, Latara Micheli [facility] 02/15/2017, 3:42 PM - For [facility] Registry use --- Instructions: Press F2 to tab through selections.  Delete question if not applicable.   Post-op:  Time to Extubation: [ ]  In OR, [ ]  <12 hrs, [ ]  12-24 hrs, [ ]  > 24 hrs Vasopressors: No  ICU Stay: 3 days  Transfusion: No  If yes, 0 units given New Arrhythmia: No Ipsilateral amputation: [x ] no, [ ]  Minor, [ ]  BKA, [ ]  AKA Discharge patency: [x ] Primary, [ ]  Primary assisted, [ ]  Secondary, [ ]  Occluded Patency judged by: [ ]  Dopper only, [ ]  Palpable graft pulse, [x ] Palpable  distal pulse, [ ]  ABI inc. > 0.15, [ ]  Duplex  D/C Ambulatory Status: Ambulatory with Assistance  Complications: Wound complication: [x ] No, [ ]  Superficial, [ ]  Return to OR  Graft infection: No  Leg ischemia/emboli: [x]  No, [ ]  Yes, no Surgery, [ ]  Yes, Surgery req., [ ]  Amputation If amputation: side: [ ]  R: [ ]  minor, [ ]  BKA, [ ]  AKA; [ ]  L: [ ]  Minor, [ ]  BKA, [ ]  AKA  MI: [x ] No, [ ]  Troponin only, [ ]  EKG or Clinical CHF: No Resp failure: [x ] none, [ ]  Pneumonia, [ ]  Ventilator Chg in renal function: [x ] none, [ ]  Inc. Cr > 0.5, [ ]  Temp. Dialysis, [ ]  Permanent dialysis Stroke: [x ] None, [ ]  Minor, [ ]  Major Return to OR: No  Reason for return to OR: [ ]  Bleeding, [ ]  Infection, [ ]  Thrombosis, [ ]  Revision  Discharge medications: Statin use:  Yes ASA use:  Yes Plavix use:  No  for medical reason   Beta blocker use: Yes Coumadin use: No  for medical reason

## 2017-02-16 DIAGNOSIS — J449 Chronic obstructive pulmonary disease, unspecified: Secondary | ICD-10-CM | POA: Diagnosis not present

## 2017-02-16 DIAGNOSIS — G4733 Obstructive sleep apnea (adult) (pediatric): Secondary | ICD-10-CM | POA: Diagnosis not present

## 2017-02-16 DIAGNOSIS — F418 Other specified anxiety disorders: Secondary | ICD-10-CM | POA: Diagnosis not present

## 2017-02-16 DIAGNOSIS — I7410 Embolism and thrombosis of unspecified parts of aorta: Secondary | ICD-10-CM | POA: Diagnosis not present

## 2017-02-16 DIAGNOSIS — I429 Cardiomyopathy, unspecified: Secondary | ICD-10-CM | POA: Diagnosis not present

## 2017-02-16 DIAGNOSIS — Z48812 Encounter for surgical aftercare following surgery on the circulatory system: Secondary | ICD-10-CM | POA: Diagnosis not present

## 2017-02-16 DIAGNOSIS — R269 Unspecified abnormalities of gait and mobility: Secondary | ICD-10-CM | POA: Diagnosis not present

## 2017-02-16 DIAGNOSIS — I10 Essential (primary) hypertension: Secondary | ICD-10-CM | POA: Diagnosis not present

## 2017-02-16 DIAGNOSIS — E669 Obesity, unspecified: Secondary | ICD-10-CM | POA: Diagnosis not present

## 2017-02-16 DIAGNOSIS — E119 Type 2 diabetes mellitus without complications: Secondary | ICD-10-CM | POA: Diagnosis not present

## 2017-02-16 DIAGNOSIS — F17210 Nicotine dependence, cigarettes, uncomplicated: Secondary | ICD-10-CM | POA: Diagnosis not present

## 2017-02-16 DIAGNOSIS — I739 Peripheral vascular disease, unspecified: Secondary | ICD-10-CM | POA: Diagnosis not present

## 2017-02-18 DIAGNOSIS — J449 Chronic obstructive pulmonary disease, unspecified: Secondary | ICD-10-CM | POA: Diagnosis not present

## 2017-02-18 DIAGNOSIS — G4733 Obstructive sleep apnea (adult) (pediatric): Secondary | ICD-10-CM | POA: Diagnosis not present

## 2017-02-18 DIAGNOSIS — E669 Obesity, unspecified: Secondary | ICD-10-CM | POA: Diagnosis not present

## 2017-02-18 DIAGNOSIS — I10 Essential (primary) hypertension: Secondary | ICD-10-CM | POA: Diagnosis not present

## 2017-02-18 DIAGNOSIS — I429 Cardiomyopathy, unspecified: Secondary | ICD-10-CM | POA: Diagnosis not present

## 2017-02-18 DIAGNOSIS — E119 Type 2 diabetes mellitus without complications: Secondary | ICD-10-CM | POA: Diagnosis not present

## 2017-02-18 DIAGNOSIS — I7410 Embolism and thrombosis of unspecified parts of aorta: Secondary | ICD-10-CM | POA: Diagnosis not present

## 2017-02-18 DIAGNOSIS — F17210 Nicotine dependence, cigarettes, uncomplicated: Secondary | ICD-10-CM | POA: Diagnosis not present

## 2017-02-18 DIAGNOSIS — Z48812 Encounter for surgical aftercare following surgery on the circulatory system: Secondary | ICD-10-CM | POA: Diagnosis not present

## 2017-02-18 DIAGNOSIS — I739 Peripheral vascular disease, unspecified: Secondary | ICD-10-CM | POA: Diagnosis not present

## 2017-02-18 DIAGNOSIS — F418 Other specified anxiety disorders: Secondary | ICD-10-CM | POA: Diagnosis not present

## 2017-02-19 DIAGNOSIS — I10 Essential (primary) hypertension: Secondary | ICD-10-CM | POA: Diagnosis not present

## 2017-02-19 DIAGNOSIS — F17210 Nicotine dependence, cigarettes, uncomplicated: Secondary | ICD-10-CM | POA: Diagnosis not present

## 2017-02-19 DIAGNOSIS — I739 Peripheral vascular disease, unspecified: Secondary | ICD-10-CM | POA: Diagnosis not present

## 2017-02-19 DIAGNOSIS — F418 Other specified anxiety disorders: Secondary | ICD-10-CM | POA: Diagnosis not present

## 2017-02-19 DIAGNOSIS — J449 Chronic obstructive pulmonary disease, unspecified: Secondary | ICD-10-CM | POA: Diagnosis not present

## 2017-02-19 DIAGNOSIS — E119 Type 2 diabetes mellitus without complications: Secondary | ICD-10-CM | POA: Diagnosis not present

## 2017-02-19 DIAGNOSIS — E669 Obesity, unspecified: Secondary | ICD-10-CM | POA: Diagnosis not present

## 2017-02-19 DIAGNOSIS — I429 Cardiomyopathy, unspecified: Secondary | ICD-10-CM | POA: Diagnosis not present

## 2017-02-19 DIAGNOSIS — G4733 Obstructive sleep apnea (adult) (pediatric): Secondary | ICD-10-CM | POA: Diagnosis not present

## 2017-02-19 DIAGNOSIS — Z48812 Encounter for surgical aftercare following surgery on the circulatory system: Secondary | ICD-10-CM | POA: Diagnosis not present

## 2017-02-19 DIAGNOSIS — I7410 Embolism and thrombosis of unspecified parts of aorta: Secondary | ICD-10-CM | POA: Diagnosis not present

## 2017-02-22 DIAGNOSIS — E669 Obesity, unspecified: Secondary | ICD-10-CM | POA: Diagnosis not present

## 2017-02-22 DIAGNOSIS — I739 Peripheral vascular disease, unspecified: Secondary | ICD-10-CM | POA: Diagnosis not present

## 2017-02-22 DIAGNOSIS — I7410 Embolism and thrombosis of unspecified parts of aorta: Secondary | ICD-10-CM | POA: Diagnosis not present

## 2017-02-22 DIAGNOSIS — I429 Cardiomyopathy, unspecified: Secondary | ICD-10-CM | POA: Diagnosis not present

## 2017-02-22 DIAGNOSIS — I10 Essential (primary) hypertension: Secondary | ICD-10-CM | POA: Diagnosis not present

## 2017-02-22 DIAGNOSIS — G4733 Obstructive sleep apnea (adult) (pediatric): Secondary | ICD-10-CM | POA: Diagnosis not present

## 2017-02-22 DIAGNOSIS — E119 Type 2 diabetes mellitus without complications: Secondary | ICD-10-CM | POA: Diagnosis not present

## 2017-02-22 DIAGNOSIS — Z48812 Encounter for surgical aftercare following surgery on the circulatory system: Secondary | ICD-10-CM | POA: Diagnosis not present

## 2017-02-22 DIAGNOSIS — F17210 Nicotine dependence, cigarettes, uncomplicated: Secondary | ICD-10-CM | POA: Diagnosis not present

## 2017-02-22 DIAGNOSIS — J449 Chronic obstructive pulmonary disease, unspecified: Secondary | ICD-10-CM | POA: Diagnosis not present

## 2017-02-22 DIAGNOSIS — F418 Other specified anxiety disorders: Secondary | ICD-10-CM | POA: Diagnosis not present

## 2017-02-24 DIAGNOSIS — F418 Other specified anxiety disorders: Secondary | ICD-10-CM | POA: Diagnosis not present

## 2017-02-24 DIAGNOSIS — E669 Obesity, unspecified: Secondary | ICD-10-CM | POA: Diagnosis not present

## 2017-02-24 DIAGNOSIS — Z48812 Encounter for surgical aftercare following surgery on the circulatory system: Secondary | ICD-10-CM | POA: Diagnosis not present

## 2017-02-24 DIAGNOSIS — I739 Peripheral vascular disease, unspecified: Secondary | ICD-10-CM | POA: Diagnosis not present

## 2017-02-24 DIAGNOSIS — F17210 Nicotine dependence, cigarettes, uncomplicated: Secondary | ICD-10-CM | POA: Diagnosis not present

## 2017-02-24 DIAGNOSIS — I429 Cardiomyopathy, unspecified: Secondary | ICD-10-CM | POA: Diagnosis not present

## 2017-02-24 DIAGNOSIS — G4733 Obstructive sleep apnea (adult) (pediatric): Secondary | ICD-10-CM | POA: Diagnosis not present

## 2017-02-24 DIAGNOSIS — I7410 Embolism and thrombosis of unspecified parts of aorta: Secondary | ICD-10-CM | POA: Diagnosis not present

## 2017-02-24 DIAGNOSIS — J449 Chronic obstructive pulmonary disease, unspecified: Secondary | ICD-10-CM | POA: Diagnosis not present

## 2017-02-24 DIAGNOSIS — E119 Type 2 diabetes mellitus without complications: Secondary | ICD-10-CM | POA: Diagnosis not present

## 2017-02-24 DIAGNOSIS — I10 Essential (primary) hypertension: Secondary | ICD-10-CM | POA: Diagnosis not present

## 2017-02-26 DIAGNOSIS — E669 Obesity, unspecified: Secondary | ICD-10-CM | POA: Diagnosis not present

## 2017-02-26 DIAGNOSIS — I429 Cardiomyopathy, unspecified: Secondary | ICD-10-CM | POA: Diagnosis not present

## 2017-02-26 DIAGNOSIS — I10 Essential (primary) hypertension: Secondary | ICD-10-CM | POA: Diagnosis not present

## 2017-02-26 DIAGNOSIS — G4733 Obstructive sleep apnea (adult) (pediatric): Secondary | ICD-10-CM | POA: Diagnosis not present

## 2017-02-26 DIAGNOSIS — I739 Peripheral vascular disease, unspecified: Secondary | ICD-10-CM | POA: Diagnosis not present

## 2017-02-26 DIAGNOSIS — F17210 Nicotine dependence, cigarettes, uncomplicated: Secondary | ICD-10-CM | POA: Diagnosis not present

## 2017-02-26 DIAGNOSIS — I7410 Embolism and thrombosis of unspecified parts of aorta: Secondary | ICD-10-CM | POA: Diagnosis not present

## 2017-02-26 DIAGNOSIS — E119 Type 2 diabetes mellitus without complications: Secondary | ICD-10-CM | POA: Diagnosis not present

## 2017-02-26 DIAGNOSIS — Z48812 Encounter for surgical aftercare following surgery on the circulatory system: Secondary | ICD-10-CM | POA: Diagnosis not present

## 2017-02-26 DIAGNOSIS — J449 Chronic obstructive pulmonary disease, unspecified: Secondary | ICD-10-CM | POA: Diagnosis not present

## 2017-02-26 DIAGNOSIS — F418 Other specified anxiety disorders: Secondary | ICD-10-CM | POA: Diagnosis not present

## 2017-03-01 ENCOUNTER — Other Ambulatory Visit: Payer: Self-pay | Admitting: Vascular Surgery

## 2017-03-01 DIAGNOSIS — F418 Other specified anxiety disorders: Secondary | ICD-10-CM | POA: Diagnosis not present

## 2017-03-01 DIAGNOSIS — Z48812 Encounter for surgical aftercare following surgery on the circulatory system: Secondary | ICD-10-CM | POA: Diagnosis not present

## 2017-03-01 DIAGNOSIS — E669 Obesity, unspecified: Secondary | ICD-10-CM | POA: Diagnosis not present

## 2017-03-01 DIAGNOSIS — F17210 Nicotine dependence, cigarettes, uncomplicated: Secondary | ICD-10-CM | POA: Diagnosis not present

## 2017-03-01 DIAGNOSIS — I739 Peripheral vascular disease, unspecified: Secondary | ICD-10-CM | POA: Diagnosis not present

## 2017-03-01 DIAGNOSIS — E119 Type 2 diabetes mellitus without complications: Secondary | ICD-10-CM | POA: Diagnosis not present

## 2017-03-01 DIAGNOSIS — I429 Cardiomyopathy, unspecified: Secondary | ICD-10-CM | POA: Diagnosis not present

## 2017-03-01 DIAGNOSIS — J449 Chronic obstructive pulmonary disease, unspecified: Secondary | ICD-10-CM | POA: Diagnosis not present

## 2017-03-01 DIAGNOSIS — I10 Essential (primary) hypertension: Secondary | ICD-10-CM | POA: Diagnosis not present

## 2017-03-01 DIAGNOSIS — I7410 Embolism and thrombosis of unspecified parts of aorta: Secondary | ICD-10-CM | POA: Diagnosis not present

## 2017-03-01 DIAGNOSIS — G4733 Obstructive sleep apnea (adult) (pediatric): Secondary | ICD-10-CM | POA: Diagnosis not present

## 2017-03-02 ENCOUNTER — Telehealth: Payer: Self-pay | Admitting: Vascular Surgery

## 2017-03-02 NOTE — Telephone Encounter (Signed)
-----   Message from Sharee PimpleMarilyn K McChesney, RN sent at 02/16/2017  2:46 PM EDT ----- Regarding: FW: charge This postop appt got missed at discharge, please bring the patient in to see Dr. Arbie CookeyEarly second week of July and she needs an ABI per Marisue HumbleMaureen.   ----- Message ----- From: Sharee PimpleMcChesney, Marilyn K, RN Sent: 02/08/2017   2:05 PM To: Deno Lungeregina J Seymour Subject: charge                                           ----- Message ----- From: Larina EarthlyEarly, Todd F, MD Sent: 02/08/2017   1:14 PM To: [facility] Clinical Pool, [facility] Charge Pool  Aortobifemoral bypass and left common femoral endarterectomy assistant Dr. Edilia Boickson and Lianne CureMaureen Collins

## 2017-03-02 NOTE — Telephone Encounter (Signed)
Sched appt 03/30/17; lab at 11:00 and MD at 11:30. Lm on cell#.

## 2017-03-04 DIAGNOSIS — I429 Cardiomyopathy, unspecified: Secondary | ICD-10-CM | POA: Diagnosis not present

## 2017-03-04 DIAGNOSIS — I10 Essential (primary) hypertension: Secondary | ICD-10-CM | POA: Diagnosis not present

## 2017-03-04 DIAGNOSIS — F418 Other specified anxiety disorders: Secondary | ICD-10-CM | POA: Diagnosis not present

## 2017-03-04 DIAGNOSIS — F17210 Nicotine dependence, cigarettes, uncomplicated: Secondary | ICD-10-CM | POA: Diagnosis not present

## 2017-03-04 DIAGNOSIS — G4733 Obstructive sleep apnea (adult) (pediatric): Secondary | ICD-10-CM | POA: Diagnosis not present

## 2017-03-04 DIAGNOSIS — J449 Chronic obstructive pulmonary disease, unspecified: Secondary | ICD-10-CM | POA: Diagnosis not present

## 2017-03-04 DIAGNOSIS — I739 Peripheral vascular disease, unspecified: Secondary | ICD-10-CM | POA: Diagnosis not present

## 2017-03-04 DIAGNOSIS — Z48812 Encounter for surgical aftercare following surgery on the circulatory system: Secondary | ICD-10-CM | POA: Diagnosis not present

## 2017-03-04 DIAGNOSIS — E119 Type 2 diabetes mellitus without complications: Secondary | ICD-10-CM | POA: Diagnosis not present

## 2017-03-04 DIAGNOSIS — E669 Obesity, unspecified: Secondary | ICD-10-CM | POA: Diagnosis not present

## 2017-03-04 DIAGNOSIS — I7410 Embolism and thrombosis of unspecified parts of aorta: Secondary | ICD-10-CM | POA: Diagnosis not present

## 2017-03-08 DIAGNOSIS — I10 Essential (primary) hypertension: Secondary | ICD-10-CM | POA: Diagnosis not present

## 2017-03-08 DIAGNOSIS — G4733 Obstructive sleep apnea (adult) (pediatric): Secondary | ICD-10-CM | POA: Diagnosis not present

## 2017-03-08 DIAGNOSIS — I739 Peripheral vascular disease, unspecified: Secondary | ICD-10-CM | POA: Diagnosis not present

## 2017-03-08 DIAGNOSIS — I429 Cardiomyopathy, unspecified: Secondary | ICD-10-CM | POA: Diagnosis not present

## 2017-03-08 DIAGNOSIS — F17210 Nicotine dependence, cigarettes, uncomplicated: Secondary | ICD-10-CM | POA: Diagnosis not present

## 2017-03-08 DIAGNOSIS — J449 Chronic obstructive pulmonary disease, unspecified: Secondary | ICD-10-CM | POA: Diagnosis not present

## 2017-03-08 DIAGNOSIS — E119 Type 2 diabetes mellitus without complications: Secondary | ICD-10-CM | POA: Diagnosis not present

## 2017-03-08 DIAGNOSIS — F418 Other specified anxiety disorders: Secondary | ICD-10-CM | POA: Diagnosis not present

## 2017-03-08 DIAGNOSIS — Z48812 Encounter for surgical aftercare following surgery on the circulatory system: Secondary | ICD-10-CM | POA: Diagnosis not present

## 2017-03-08 DIAGNOSIS — E669 Obesity, unspecified: Secondary | ICD-10-CM | POA: Diagnosis not present

## 2017-03-08 DIAGNOSIS — I7410 Embolism and thrombosis of unspecified parts of aorta: Secondary | ICD-10-CM | POA: Diagnosis not present

## 2017-03-11 DIAGNOSIS — F17210 Nicotine dependence, cigarettes, uncomplicated: Secondary | ICD-10-CM | POA: Diagnosis not present

## 2017-03-11 DIAGNOSIS — J449 Chronic obstructive pulmonary disease, unspecified: Secondary | ICD-10-CM | POA: Diagnosis not present

## 2017-03-11 DIAGNOSIS — G4733 Obstructive sleep apnea (adult) (pediatric): Secondary | ICD-10-CM | POA: Diagnosis not present

## 2017-03-11 DIAGNOSIS — I10 Essential (primary) hypertension: Secondary | ICD-10-CM | POA: Diagnosis not present

## 2017-03-11 DIAGNOSIS — I7410 Embolism and thrombosis of unspecified parts of aorta: Secondary | ICD-10-CM | POA: Diagnosis not present

## 2017-03-11 DIAGNOSIS — I429 Cardiomyopathy, unspecified: Secondary | ICD-10-CM | POA: Diagnosis not present

## 2017-03-11 DIAGNOSIS — F418 Other specified anxiety disorders: Secondary | ICD-10-CM | POA: Diagnosis not present

## 2017-03-11 DIAGNOSIS — E669 Obesity, unspecified: Secondary | ICD-10-CM | POA: Diagnosis not present

## 2017-03-11 DIAGNOSIS — E119 Type 2 diabetes mellitus without complications: Secondary | ICD-10-CM | POA: Diagnosis not present

## 2017-03-11 DIAGNOSIS — I739 Peripheral vascular disease, unspecified: Secondary | ICD-10-CM | POA: Diagnosis not present

## 2017-03-11 DIAGNOSIS — Z48812 Encounter for surgical aftercare following surgery on the circulatory system: Secondary | ICD-10-CM | POA: Diagnosis not present

## 2017-03-17 DIAGNOSIS — D500 Iron deficiency anemia secondary to blood loss (chronic): Secondary | ICD-10-CM | POA: Diagnosis not present

## 2017-03-22 ENCOUNTER — Encounter: Payer: Self-pay | Admitting: Vascular Surgery

## 2017-03-23 NOTE — Addendum Note (Signed)
Addended by: Burton ApleyPETTY, Mekaela Azizi A on: 03/23/2017 04:55 PM   Modules accepted: Orders

## 2017-03-30 ENCOUNTER — Telehealth: Payer: Self-pay | Admitting: *Deleted

## 2017-03-30 ENCOUNTER — Ambulatory Visit (HOSPITAL_COMMUNITY)
Admission: RE | Admit: 2017-03-30 | Discharge: 2017-03-30 | Disposition: A | Discharge status: Home / Self Care | Payer: BLUE CROSS/BLUE SHIELD | Source: Ambulatory Visit | Attending: Vascular Surgery | Admitting: Vascular Surgery

## 2017-03-30 ENCOUNTER — Other Ambulatory Visit: Payer: Self-pay | Admitting: *Deleted

## 2017-03-30 ENCOUNTER — Ambulatory Visit (INDEPENDENT_AMBULATORY_CARE_PROVIDER_SITE_OTHER): Payer: Self-pay | Admitting: Vascular Surgery

## 2017-03-30 ENCOUNTER — Encounter: Payer: Self-pay | Admitting: Vascular Surgery

## 2017-03-30 VITALS — BP 150/85 | HR 78 | Temp 98.2°F | Resp 20 | Ht 65.0 in | Wt 225.0 lb

## 2017-03-30 DIAGNOSIS — I739 Peripheral vascular disease, unspecified: Secondary | ICD-10-CM

## 2017-03-30 DIAGNOSIS — E785 Hyperlipidemia, unspecified: Secondary | ICD-10-CM | POA: Diagnosis not present

## 2017-03-30 DIAGNOSIS — Z87891 Personal history of nicotine dependence: Secondary | ICD-10-CM | POA: Diagnosis not present

## 2017-03-30 DIAGNOSIS — R938 Abnormal findings on diagnostic imaging of other specified body structures: Secondary | ICD-10-CM | POA: Insufficient documentation

## 2017-03-30 DIAGNOSIS — R0989 Other specified symptoms and signs involving the circulatory and respiratory systems: Secondary | ICD-10-CM | POA: Insufficient documentation

## 2017-03-30 DIAGNOSIS — E1151 Type 2 diabetes mellitus with diabetic peripheral angiopathy without gangrene: Secondary | ICD-10-CM | POA: Insufficient documentation

## 2017-03-30 DIAGNOSIS — I10 Essential (primary) hypertension: Secondary | ICD-10-CM | POA: Diagnosis not present

## 2017-03-30 NOTE — Telephone Encounter (Signed)
Nicotine patch called in 21,14,7 mg with 2 refills.

## 2017-03-30 NOTE — Progress Notes (Signed)
   Patient name: Lori Carter MRN: [id number] DOB: 05/03/1966 Sex: female  REASON FOR VISIT: Follow-up bifemoral bypass and left common femoral artery endarterectomy on 02/08/2017  HPI: Lori LaineSonya Delap is a 51 y.o. female here today for follow-up. She looks quite good today. She does report slow return of her appetite and bowel function. She had lost approximate 20 pounds and is regained approximate 5 of these pounds back. She does report some hip soreness with walking but has had resolution of her claudication symptoms.  Current Outpatient Prescriptions  Medication Sig Dispense Refill  . albuterol (PROVENTIL HFA;VENTOLIN HFA) 108 (90 Base) MCG/ACT inhaler Inhale 2 puffs into the lungs every 6 (six) hours as needed for wheezing or shortness of breath.    Marland Kitchen. aspirin EC 81 MG tablet Take 162 mg by mouth daily.    Marland Kitchen. atorvastatin (LIPITOR) 10 MG tablet Take 10 mg by mouth daily.    . carvedilol (COREG) 25 MG tablet Take 25 mg by mouth 2 (two) times daily with a meal.    . citalopram (CELEXA) 20 MG tablet Take 20 mg by mouth daily.    Marland Kitchen. gabapentin (NEURONTIN) 300 MG capsule Take 300 mg by mouth 3 (three) times daily.    . hydrochlorothiazide (HYDRODIURIL) 25 MG tablet Take 25 mg by mouth daily.     . Menthol-Zinc Oxide (GOLD BOND EX) Apply 1 application topically as needed (for skin).    . metFORMIN (GLUCOPHAGE) 500 MG tablet Take 500 mg by mouth 2 (two) times daily with a meal.    . Olmesartan-Amlodipine-HCTZ (TRIBENZOR) 40-10-25 MG TABS Take 1 tablet by mouth daily.    Marland Kitchen. oxyCODONE-acetaminophen (PERCOCET/ROXICET) 5-325 MG tablet Take 1 tablet by mouth every 4 (four) hours as needed for moderate pain. 30 tablet 0  . varenicline (CHANTIX STARTING MONTH PAK) 0.5 MG X 11 & 1 MG X 42 tablet Take one 0.5 mg tablet by mouth once daily for 3 days, then increase to one 0.5 mg tablet twice daily for 4 days, then increase to one 1 mg tablet twice daily. 53 tablet 0   No  current facility-administered medications for this visit.      PHYSICAL EXAM: Vitals:   03/30/17 1146  BP: (!) 150/85  Pulse: 78  Resp: 20  Temp: 98.2 F (36.8 C)  TempSrc: Oral  SpO2: 94%  Weight: 225 lb (102.1 kg)  Height: 5\' 5"  (1.651 m)    GENERAL: The patient is a well-nourished female, in no acute distress. The vital signs are documented above. Abdominal and groin incisions are healing quite nicely. She does have palpable femoral pulses bilaterally. She does have a 1+ left dorsalis pedis pulse. I do not palpate pedal pulses on the right  MEDICAL ISSUES: Stable one month status post aortobifemoral bypass. She will continue her walking activities. She will return to work with a limit of 20 pounds lifting for an additional month and a half. She is released for full activity at 3 months out from her June 11 surgery. She is trying complete smoking cessation. She reports that she is at 3 cigarettes since discharge. She has requested and is given a prescription for nicotine patches. Will be seen again in 3 months for continued follow-up   Larina Earthlyodd F. Tiron Suski, MD [facility] Office Tel [phone] Pager [phone]

## 2017-06-25 DIAGNOSIS — I10 Essential (primary) hypertension: Secondary | ICD-10-CM | POA: Diagnosis not present

## 2017-06-25 DIAGNOSIS — E1142 Type 2 diabetes mellitus with diabetic polyneuropathy: Secondary | ICD-10-CM | POA: Diagnosis not present

## 2017-06-25 DIAGNOSIS — E782 Mixed hyperlipidemia: Secondary | ICD-10-CM | POA: Diagnosis not present

## 2017-06-25 DIAGNOSIS — G4733 Obstructive sleep apnea (adult) (pediatric): Secondary | ICD-10-CM | POA: Diagnosis not present

## 2017-06-25 DIAGNOSIS — Z23 Encounter for immunization: Secondary | ICD-10-CM | POA: Diagnosis not present

## 2017-06-29 DIAGNOSIS — R599 Enlarged lymph nodes, unspecified: Secondary | ICD-10-CM | POA: Diagnosis not present

## 2017-06-29 DIAGNOSIS — R928 Other abnormal and inconclusive findings on diagnostic imaging of breast: Secondary | ICD-10-CM | POA: Diagnosis not present

## 2017-06-29 DIAGNOSIS — N6331 Unspecified lump in axillary tail of the right breast: Secondary | ICD-10-CM | POA: Diagnosis not present

## 2017-07-06 ENCOUNTER — Ambulatory Visit: Payer: BLUE CROSS/BLUE SHIELD | Admitting: Vascular Surgery

## 2017-07-19 DIAGNOSIS — K219 Gastro-esophageal reflux disease without esophagitis: Secondary | ICD-10-CM | POA: Diagnosis not present

## 2017-07-20 ENCOUNTER — Telehealth: Payer: Self-pay | Admitting: Gastroenterology

## 2017-07-20 NOTE — Telephone Encounter (Signed)
I reviewed the information.

## 2017-07-20 NOTE — Telephone Encounter (Signed)
Received referral for patient to see Dr. Christella HartiganJacobs for an EUS with gastric mass. Referral placed on nurse Patty's desk for review.

## 2017-07-20 NOTE — Telephone Encounter (Signed)
Dr Jacobs the records are on your desk for review.  

## 2017-07-27 ENCOUNTER — Ambulatory Visit (INDEPENDENT_AMBULATORY_CARE_PROVIDER_SITE_OTHER): Payer: BLUE CROSS/BLUE SHIELD | Admitting: Vascular Surgery

## 2017-07-27 ENCOUNTER — Encounter: Payer: Self-pay | Admitting: Vascular Surgery

## 2017-07-27 VITALS — BP 150/80 | HR 81 | Temp 98.8°F | Resp 16 | Ht 65.5 in | Wt 238.0 lb

## 2017-07-27 DIAGNOSIS — I739 Peripheral vascular disease, unspecified: Secondary | ICD-10-CM | POA: Diagnosis not present

## 2017-07-27 NOTE — Progress Notes (Signed)
HISTORY AND PHYSICAL     CC:  Follow up Requesting Provider:  Abigail MiyamotoPerry, Lawrence Edward,*  HPI: This is a 51 y.o. female who is s/p aortobifemoral bypass grafting by Dr. Arbie CookeyEarly on 02/08/17.  She returns today for follow up.  She states that she has done well since surgery.  She states that her left leg feels heavy and feels like concrete.  She states this has been present since surgery and is not new.  Her right leg is fine.  She states that her claudication has resolved since surgery.  She no longer needs the handicap buggy at [employer].  She denies any non healing wounds on her feet.  She states that her midline incision itches and the left groin incision feels tight.  She states she does have some leg swelling and it is worse today because she did not take her fluid pill this morning.   She states she was doing well with quitting smoking, but her Grandmother was placed in a SNF and this has been a stressful time for her and she started back smoking.    She is on oral agent for DM.  She is on aspirin/statin daily.  She is on a beta blocker, ARB, and CCB for blood pressure management.    Past Medical History:  Diagnosis Date  . Acne   . Anxiety   . Atopic neurodermatitis   . Cardiomegaly   . Cellulitis of toe of left foot    acute  . Complication of anesthesia    woke up during surgery one   . COPD (chronic obstructive pulmonary disease) (HCC)   . Diabetic neuropathy (HCC)   . Dyspnea    with exertion  . Dysthymic disorder   . Epidermal inclusion cyst   . GERD (gastroesophageal reflux disease)    not taking anything  . Heart murmur    told years ago- , no one has said anything in years  . Hyperlipidemia   . Hypertension, benign    chronic  . Obstructive sleep apnea   . Type 2 diabetes mellitus without complication (HCC)    chronic    Past Surgical History:  Procedure Laterality Date  . AORTA - BILATERAL FEMORAL ARTERY BYPASS GRAFT N/A 02/08/2017   Procedure: AORTA BIFEMORAL  BYPASS GRAFT;  Surgeon: Larina EarthlyEarly, Todd F, MD;  Location: [facility] OR;  Service: Vascular;  Laterality: N/A;  . none    . NOVASURE ABLATION    . SHOULDER SURGERY Left    rotator cuff  . TUBAL LIGATION      Allergies  Allergen Reactions  . No Known Allergies     Current Outpatient Medications  Medication Sig Dispense Refill  . albuterol (PROVENTIL HFA;VENTOLIN HFA) 108 (90 Base) MCG/ACT inhaler Inhale 2 puffs into the lungs every 6 (six) hours as needed for wheezing or shortness of breath.    Marland Kitchen. aspirin EC 81 MG tablet Take 162 mg by mouth daily.    Marland Kitchen. atorvastatin (LIPITOR) 10 MG tablet Take 10 mg by mouth daily.    . carvedilol (COREG) 25 MG tablet Take 25 mg by mouth 2 (two) times daily with a meal.    . citalopram (CELEXA) 20 MG tablet Take 20 mg by mouth daily.    Marland Kitchen. gabapentin (NEURONTIN) 300 MG capsule Take 300 mg by mouth 3 (three) times daily.    . hydrochlorothiazide (HYDRODIURIL) 25 MG tablet Take 25 mg by mouth daily.     . Menthol-Zinc Oxide (GOLD BOND EX) Apply 1  application topically as needed (for skin).    . metFORMIN (GLUCOPHAGE) 500 MG tablet Take 500 mg by mouth 2 (two) times daily with a meal.    . Olmesartan-Amlodipine-HCTZ (TRIBENZOR) 40-10-25 MG TABS Take 1 tablet by mouth daily.    Marland Kitchen. oxyCODONE-acetaminophen (PERCOCET/ROXICET) 5-325 MG tablet Take 1 tablet by mouth every 4 (four) hours as needed for moderate pain. 30 tablet 0  . varenicline (CHANTIX STARTING MONTH PAK) 0.5 MG X 11 & 1 MG X 42 tablet Take one 0.5 mg tablet by mouth once daily for 3 days, then increase to one 0.5 mg tablet twice daily for 4 days, then increase to one 1 mg tablet twice daily. 53 tablet 0   No current facility-administered medications for this visit.     Family History  Problem Relation Age of Onset  . Hypertension Mother   . Clotting disorder Mother   . Heart murmur Father   . Pancreatic cancer Father   . Diabetes Father   . Hypertension Father     Social History   Socioeconomic  History  . Marital status: Divorced    Spouse name: Not on file  . Number of children: Not on file  . Years of education: Not on file  . Highest education level: Not on file  Social Needs  . Financial resource strain: Not on file  . Food insecurity - worry: Not on file  . Food insecurity - inability: Not on file  . Transportation needs - medical: Not on file  . Transportation needs - non-medical: Not on file  Occupational History  . Occupation: CNA  Tobacco Use  . Smoking status: Light Tobacco Smoker    Packs/day: 0.33    Years: 29.00    Pack years: 9.57  . Smokeless tobacco: Never Used  Substance and Sexual Activity  . Alcohol use: No  . Drug use: No  . Sexual activity: Not on file  Other Topics Concern  . Not on file  Social History Narrative  . Not on file     REVIEW OF SYSTEMS:   [X]  denotes positive finding, [ ]  denotes negative finding Cardiac  Comments:  Chest pain or chest pressure:    Shortness of breath upon exertion: x   Short of breath when lying flat: x   Irregular heart rhythm:        Vascular    Pain in calf, thigh, or hip brought on by ambulation: x Left thigh  Pain in feet at night that wakes you up from your sleep:     Blood clot in your veins:    Leg swelling:  x       Pulmonary    Oxygen at home:    Productive cough:     Wheezing:         Neurologic    Sudden weakness in arms or legs:     Sudden numbness in arms or legs:     Sudden onset of difficulty speaking or slurred speech:    Temporary loss of vision in one eye:     Problems with dizziness:         Gastrointestinal    Blood in stool:     Vomited blood:         Genitourinary    Burning when urinating:     Blood in urine:        Psychiatric    Major depression:         Hematologic    Bleeding  problems:    Problems with blood clotting too easily:        Skin    Rashes or ulcers:        Constitutional    Fever or chills:      PHYSICAL EXAMINATION:  Vitals:    07/27/17 1247 07/27/17 1248  BP: (!) 151/82 (!) 150/80  Pulse: 81   Resp: 16   Temp: 98.8 F (37.1 C)   SpO2: 95%    Vitals:   07/27/17 1247  Weight: 238 lb (108 kg)  Height: 5' 5.5" (1.664 m)   Body mass index is 39 kg/m.  General:  WDWN in NAD; vital signs documented above Gait: Not observed HENT: WNL, normocephalic Pulmonary: normal non-labored breathing , without Rales, rhonchi,  wheezing Cardiac: regular HR Abdomen: soft, NT, no masses Skin: without rashes; well healed laparotomy incision and bilateral groin incisions. Vascular Exam/Pulses:  Right Left  Radial 2+ (normal) 2+ (normal)  Femoral Unable to palpate  trace  Popliteal Unable to palpate  Unable to palpate   DP 2+ (normal) 2+ (normal)  PT Unable to palpate  Unable to palpate    Extremities: without ischemic changes, without Gangrene , without cellulitis; without open wounds;  Musculoskeletal: no muscle wasting or atrophy  Neurologic: A&O X 3;  No focal weakness or paresthesias are detected Psychiatric:  The pt has Normal affect.   Non-Invasive Vascular Imaging:   None today  Previous ABI/TBI 03/30/17 Right:  0.75/0.64 Left:  0.66/0.62  Pt meds includes: Statin:  Yes.   Beta Blocker:  Yes.   Aspirin:  Yes.   ACEI:  No. ARB:  Yes.   CCB use:  Yes Other Antiplatelet/Anticoagulant:  No   ASSESSMENT/PLAN:: 51 y.o. female who is s/p aortobifemoral bypass grafting 02/08/17 who returns today for follow up.   -pt doing well since surgery with palpable DP pulses despite decreased ABI's in July.   -She is having heaviness in her left leg and some discomfort in her left thigh after being up and around for a bit.  This is most likely related to nerve issues.  If this persist, she may need to see a neurologist or spine physician to be evaluated.   -incisions have healed nicely-discussed with pt over the next 6 months, these will continue to improve. -she will f/u in 6 months with ABI's -encouraged pt to  work on smoking cessation    Doreatha Massed, PA-C [facility] [phone]  Clinic MD:  Pt seen and examined with Dr. Rock Nephew seen and examined with Dr. Arbie Cookey.    I have examined the patient, reviewed and agree with above.  Still with left hip discomfort.  Otherwise walking without difficulty.  She is pleased with her progress.  Gretta Began, MD 07/27/2017 1:23 PM

## 2017-08-02 DIAGNOSIS — D1721 Benign lipomatous neoplasm of skin and subcutaneous tissue of right arm: Secondary | ICD-10-CM | POA: Diagnosis not present

## 2017-08-02 DIAGNOSIS — I10 Essential (primary) hypertension: Secondary | ICD-10-CM | POA: Diagnosis not present

## 2017-08-12 ENCOUNTER — Other Ambulatory Visit: Payer: Self-pay

## 2017-08-12 ENCOUNTER — Telehealth: Payer: Self-pay

## 2017-08-12 DIAGNOSIS — K3189 Other diseases of stomach and duodenum: Secondary | ICD-10-CM

## 2017-08-12 NOTE — Telephone Encounter (Signed)
EUS scheduled, pt instructed and medications reviewed.  Patient instructions mailed to home.  Patient to call with any questions or concerns.  

## 2017-08-12 NOTE — Telephone Encounter (Signed)
Left message on machine to call back  

## 2017-08-12 NOTE — Telephone Encounter (Signed)
EUS scheduled for 09/09/17 11:45 am

## 2017-09-01 ENCOUNTER — Encounter (HOSPITAL_COMMUNITY): Payer: Self-pay | Admitting: *Deleted

## 2017-09-01 ENCOUNTER — Other Ambulatory Visit: Payer: Self-pay

## 2017-09-07 DIAGNOSIS — J069 Acute upper respiratory infection, unspecified: Secondary | ICD-10-CM | POA: Diagnosis not present

## 2017-09-07 DIAGNOSIS — J09X2 Influenza due to identified novel influenza A virus with other respiratory manifestations: Secondary | ICD-10-CM | POA: Diagnosis not present

## 2017-09-08 ENCOUNTER — Telehealth: Payer: Self-pay | Admitting: Gastroenterology

## 2017-09-08 NOTE — Telephone Encounter (Signed)
Patient advised ok to keep procedure as scheduled as long as she does not have a fever.  She will take her cough medicine before 7:00 am for a 11:00 procedure

## 2017-09-09 ENCOUNTER — Encounter (HOSPITAL_COMMUNITY)
Admission: RE | Disposition: A | Discharge status: Home / Self Care | Payer: Self-pay | Source: Ambulatory Visit | Attending: Gastroenterology

## 2017-09-09 ENCOUNTER — Other Ambulatory Visit: Payer: Self-pay

## 2017-09-09 ENCOUNTER — Encounter (HOSPITAL_COMMUNITY): Payer: Self-pay | Admitting: Registered Nurse

## 2017-09-09 ENCOUNTER — Ambulatory Visit (HOSPITAL_COMMUNITY)
Admission: RE | Admit: 2017-09-09 | Discharge: 2017-09-09 | Disposition: A | Discharge status: Home / Self Care | Payer: BLUE CROSS/BLUE SHIELD | Source: Ambulatory Visit | Attending: Gastroenterology | Admitting: Gastroenterology

## 2017-09-09 ENCOUNTER — Ambulatory Visit (HOSPITAL_COMMUNITY): Payer: BLUE CROSS/BLUE SHIELD | Admitting: Registered Nurse

## 2017-09-09 DIAGNOSIS — E785 Hyperlipidemia, unspecified: Secondary | ICD-10-CM | POA: Diagnosis not present

## 2017-09-09 DIAGNOSIS — Z79899 Other long term (current) drug therapy: Secondary | ICD-10-CM | POA: Diagnosis not present

## 2017-09-09 DIAGNOSIS — F17210 Nicotine dependence, cigarettes, uncomplicated: Secondary | ICD-10-CM | POA: Diagnosis not present

## 2017-09-09 DIAGNOSIS — J449 Chronic obstructive pulmonary disease, unspecified: Secondary | ICD-10-CM | POA: Diagnosis not present

## 2017-09-09 DIAGNOSIS — Z800 Family history of malignant neoplasm of digestive organs: Secondary | ICD-10-CM | POA: Insufficient documentation

## 2017-09-09 DIAGNOSIS — Z7982 Long term (current) use of aspirin: Secondary | ICD-10-CM | POA: Insufficient documentation

## 2017-09-09 DIAGNOSIS — F419 Anxiety disorder, unspecified: Secondary | ICD-10-CM | POA: Diagnosis not present

## 2017-09-09 DIAGNOSIS — K3189 Other diseases of stomach and duodenum: Secondary | ICD-10-CM | POA: Diagnosis not present

## 2017-09-09 DIAGNOSIS — F329 Major depressive disorder, single episode, unspecified: Secondary | ICD-10-CM | POA: Insufficient documentation

## 2017-09-09 DIAGNOSIS — Z6837 Body mass index (BMI) 37.0-37.9, adult: Secondary | ICD-10-CM | POA: Insufficient documentation

## 2017-09-09 DIAGNOSIS — R011 Cardiac murmur, unspecified: Secondary | ICD-10-CM | POA: Insufficient documentation

## 2017-09-09 DIAGNOSIS — I10 Essential (primary) hypertension: Secondary | ICD-10-CM | POA: Insufficient documentation

## 2017-09-09 DIAGNOSIS — K2960 Other gastritis without bleeding: Secondary | ICD-10-CM | POA: Diagnosis not present

## 2017-09-09 DIAGNOSIS — G4733 Obstructive sleep apnea (adult) (pediatric): Secondary | ICD-10-CM | POA: Diagnosis not present

## 2017-09-09 DIAGNOSIS — K219 Gastro-esophageal reflux disease without esophagitis: Secondary | ICD-10-CM | POA: Insufficient documentation

## 2017-09-09 DIAGNOSIS — Z9989 Dependence on other enabling machines and devices: Secondary | ICD-10-CM | POA: Diagnosis not present

## 2017-09-09 DIAGNOSIS — I429 Cardiomyopathy, unspecified: Secondary | ICD-10-CM | POA: Diagnosis not present

## 2017-09-09 DIAGNOSIS — E1140 Type 2 diabetes mellitus with diabetic neuropathy, unspecified: Secondary | ICD-10-CM | POA: Insufficient documentation

## 2017-09-09 DIAGNOSIS — Z7984 Long term (current) use of oral hypoglycemic drugs: Secondary | ICD-10-CM | POA: Insufficient documentation

## 2017-09-09 DIAGNOSIS — K319 Disease of stomach and duodenum, unspecified: Secondary | ICD-10-CM | POA: Diagnosis present

## 2017-09-09 DIAGNOSIS — K2970 Gastritis, unspecified, without bleeding: Secondary | ICD-10-CM

## 2017-09-09 DIAGNOSIS — K259 Gastric ulcer, unspecified as acute or chronic, without hemorrhage or perforation: Secondary | ICD-10-CM | POA: Diagnosis not present

## 2017-09-09 HISTORY — PX: EUS: SHX5427

## 2017-09-09 LAB — GLUCOSE, CAPILLARY: Glucose-Capillary: 99 mg/dL (ref 65–99)

## 2017-09-09 SURGERY — UPPER ENDOSCOPIC ULTRASOUND (EUS) LINEAR
Anesthesia: Monitor Anesthesia Care

## 2017-09-09 MED ORDER — PROPOFOL 10 MG/ML IV BOLUS
INTRAVENOUS | Status: DC | PRN
  Administered 2017-09-09: 30 mg via INTRAVENOUS
  Administered 2017-09-09 (×3): 20 mg via INTRAVENOUS
  Administered 2017-09-09 (×3): 10 mg via INTRAVENOUS

## 2017-09-09 MED ORDER — LIDOCAINE 2% (20 MG/ML) 5 ML SYRINGE
INTRAMUSCULAR | Status: DC | PRN
  Administered 2017-09-09: 60 mg via INTRAVENOUS

## 2017-09-09 MED ORDER — SODIUM CHLORIDE 0.9 % IV SOLN: INTRAVENOUS | Status: DC

## 2017-09-09 MED ORDER — PROPOFOL 10 MG/ML IV BOLUS
INTRAVENOUS | Status: AC
  Filled 2017-09-09: qty 20

## 2017-09-09 MED ORDER — LACTATED RINGERS IV SOLN
INTRAVENOUS | Status: DC
  Administered 2017-09-09: 1000 mL via INTRAVENOUS

## 2017-09-09 MED ORDER — ONDANSETRON HCL 4 MG/2ML IJ SOLN
INTRAMUSCULAR | Status: DC | PRN
  Administered 2017-09-09: 4 mg via INTRAVENOUS

## 2017-09-09 NOTE — Anesthesia Preprocedure Evaluation (Signed)
Anesthesia Evaluation  Patient identified by MRN, date of birth, ID band Patient awake    Reviewed: Allergy & Precautions, H&P , NPO status , Patient's Chart, lab work & pertinent test results  Airway Mallampati: II   Neck ROM: full    Dental   Pulmonary shortness of breath, sleep apnea , COPD, Current Smoker,    breath sounds clear to auscultation       Cardiovascular hypertension,  Rhythm:regular Rate:Normal     Neuro/Psych PSYCHIATRIC DISORDERS Anxiety Depression    GI/Hepatic GERD  ,  Endo/Other  diabetes, Type 2Morbid obesity  Renal/GU      Musculoskeletal   Abdominal   Peds  Hematology   Anesthesia Other Findings   Reproductive/Obstetrics                             Anesthesia Physical Anesthesia Plan  ASA: II  Anesthesia Plan: MAC   Post-op Pain Management:    Induction: Intravenous  PONV Risk Score and Plan: 1 and Propofol infusion and Treatment may vary due to age or medical condition  Airway Management Planned: Nasal Cannula  Additional Equipment:   Intra-op Plan:   Post-operative Plan:   Informed Consent: I have reviewed the patients History and Physical, chart, labs and discussed the procedure including the risks, benefits and alternatives for the proposed anesthesia with the patient or authorized representative who has indicated his/her understanding and acceptance.     Plan Discussed with: CRNA, Anesthesiologist and Surgeon  Anesthesia Plan Comments:         Anesthesia Quick Evaluation

## 2017-09-09 NOTE — Transfer of Care (Signed)
Immediate Anesthesia Transfer of Care Note  Patient: Lori Carter  Procedure(s) Performed: UPPER ENDOSCOPIC ULTRASOUND (EUS) LINEAR (N/A )  Patient Location: PACU  Anesthesia Type:MAC  Level of Consciousness: sedated  Airway & Oxygen Therapy: Patient Spontanous Breathing and Patient connected to face mask oxygen  Post-op Assessment: Report given to RN and Post -op Vital signs reviewed and stable  Post vital signs: Reviewed and stable  Last Vitals:  Vitals:   09/09/17 1004  BP: (!) 148/69  Pulse: 77  Resp: 17  Temp: 37.3 C  SpO2: 91%    Last Pain:  Vitals:   09/09/17 1004  TempSrc: Oral         Complications: No apparent anesthesia complications

## 2017-09-09 NOTE — Discharge Instructions (Signed)

## 2017-09-09 NOTE — Anesthesia Postprocedure Evaluation (Signed)
Anesthesia Post Note  Patient: Lori Carter  Procedure(s) Performed: UPPER ENDOSCOPIC ULTRASOUND (EUS) LINEAR (N/A )     Patient location during evaluation: PACU Anesthesia Type: MAC Level of consciousness: awake and alert Pain management: pain level controlled Vital Signs Assessment: post-procedure vital signs reviewed and stable Respiratory status: spontaneous breathing, nonlabored ventilation, respiratory function stable and patient connected to nasal cannula oxygen Cardiovascular status: stable and blood pressure returned to baseline Postop Assessment: no apparent nausea or vomiting Anesthetic complications: no    Last Vitals:  Vitals:   09/09/17 1155 09/09/17 1200  BP:  122/68  Pulse:  82  Resp:  12  Temp:    SpO2: 92% 94%    Last Pain:  Vitals:   09/09/17 1110  TempSrc:   PainSc: [place]

## 2017-09-09 NOTE — Progress Notes (Signed)
Dr. Chaney MallingHodierne by to see pt. Pt with lots of upper respiratory coughing and secretions. Pt came in just getting over upper respiratory infection. Pt sats good. No new orders. Continue with discharge plan when pt awake and ready to go.

## 2017-09-09 NOTE — Op Note (Signed)
[facility] Patient Name: Lori LaineSonya Carter Procedure Date: 09/09/2017 MRN: [id number] Attending MD: Rachael Feeaniel P Janise Gora , MD Date of Birth: [birth year] CSN: [id number] Age: [age] Admit Type: Outpatient Procedure:                Upper EUS Indications:              Gastric mucosal mass/polyp found on EGD 01/2017 Dr.                            Jennye BoroughsMisenheimer: distal gastric umbilicated submucosal                            lesion; mucosal biopsies suggested possible lipoma. Providers:                Rachael Feeaniel P. Shravan Salahuddin, MD, Tomma RakersJennifer Kappus, RN, Beryle BeamsJanie                            Billups, Technician, Doreene BurkeBeth Marshall, CRNA Referring MD:             Shara Blazingim Misenheimer, MD Medicines:                Monitored Anesthesia Care Complications:            No immediate complications. Estimated blood loss:                            None. Estimated Blood Loss:     Estimated blood loss: none. Procedure:                Pre-Anesthesia Assessment:                           - Prior to the procedure, a History and Physical                            was performed, and patient medications and                            allergies were reviewed. The patient's tolerance of                            previous anesthesia was also reviewed. The risks                            and benefits of the procedure and the sedation                            options and risks were discussed with the patient.                            All questions were answered, and informed consent                            was obtained. Prior Anticoagulants: The patient has  taken no previous anticoagulant or antiplatelet                            agents. ASA Grade Assessment: II - A patient with                            mild systemic disease. After reviewing the risks                            and benefits, the patient was deemed in                            satisfactory condition to undergo the procedure.                         After obtaining informed consent, the endoscope was                            passed under direct vision. Throughout the                            procedure, the patient's blood pressure, pulse, and                            oxygen saturations were monitored continuously. The                            [id number] ([id number]) scope was introduced through                            the mouth, and advanced to the second part of                            duodenum. The upper EUS was accomplished without                            difficulty. The patient tolerated the procedure                            well. Scope In: Scope Out: Findings:      Endoscopic Finding :      1. Normal esophagus      2. Small, soft, umbilicated lesion in the distal stomach with overlying       erosion but otherwise normal mucosa. This measures about 1cm across.       Following EUS evaluation I sampled the lesion with 'tunnel' biopsies.      3. Normal duodenum.      Endosonographic Finding :      1. The distal gastric lesion described above correlated with a       heterogeneous, hypoechoic mass that involved the mucosa and deep mucosa       layers of the gastric wall and measured 8mm across maximally.      2. Otherwisen normal EUS examination of the UGI tract Impression:               -  Soft, small, umbilicated distal gastric lesion                            that is confined to the mucosa and deep mucosa                            layers by EUS. This was sampled with 'tunnel'                            biopsied following EUS evaluation.                           - This is of unlikely to be of any clinical                            signficance; ddx includes Pancreatic Rest,                            leimyoma, carcinoid, atypical appearing lipoma.                            Await final pathology. If this is not diagnostic I                            will likely recommend interval  surveillance with                            EUS in 1-2 years. Moderate Sedation:      N/A- Per Anesthesia Care Recommendation:           - Discharge patient to home (ambulatory).                           - Await final pathology report. Procedure Code(s):        --- Professional ---                           [phone], Esophagogastroduodenoscopy, flexible,                            transoral; with endoscopic ultrasound examination                            limited to the esophagus, stomach or duodenum, and                            adjacent structures Diagnosis Code(s):        --- Professional ---                           K29.70, Gastritis, unspecified, without bleeding                           K31.89, Other diseases of stomach and duodenum CPT copyright 2016 American Medical Association. All rights reserved. The codes documented in this report are preliminary and upon  coder review may  be revised to meet current compliance requirements. Rachael Fee, MD 09/09/2017 10:59:25 AM This report has been signed electronically. Number of Addenda: 0

## 2017-09-09 NOTE — H&P (Signed)
HPI: This is a 52 yo woman  Chief complaint is gastric mass  Sent by Dr. Jennye BoroughsMisenheimer for 01/2017 noted distal gastric submucosal lesion, incidentally noted uring EGD for reflux, chronic cough  ROS: complete GI ROS as described in HPI, all other review negative.  Constitutional:  No unintentional weight loss   Past Medical History:  Diagnosis Date  . Acne   . Anxiety   . Atopic neurodermatitis   . Cardiomegaly   . Cellulitis of toe of left foot    acute  . Complication of anesthesia    woke up during surgery one  20 some years ago  . COPD (chronic obstructive pulmonary disease) (HCC)   . Diabetic neuropathy (HCC)    fingers  . Dyspnea    with exertion  . Dysthymic disorder   . Epidermal inclusion cyst   . GERD (gastroesophageal reflux disease)    not taking anything  . Heart murmur    told years ago- , no one has said anything in years  . Hyperlipidemia   . Hypertension, benign    chronic  . Obstructive sleep apnea    uses cpap set on 13 uses 1/2 of the tiem   . Type 2 diabetes mellitus without complication (HCC)    chronic    Past Surgical History:  Procedure Laterality Date  . 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 none    . NOVASURE ABLATION    . SHOULDER SURGERY Left    rotator cuff  . TUBAL LIGATION      Current Facility-Administered Medications  Medication Dose Route Frequency Provider Last Rate Last Dose  . 0.9 %  sodium chloride infusion   Intravenous Continuous Rachael FeeJacobs, Hania Cerone P, MD        Allergies as of 08/12/2017 - Review Complete 07/27/2017  Allergen Reaction Noted  . No known allergies  02/05/2017    Family History  Problem Relation Age of Onset  . Hypertension Mother   . Clotting disorder Mother   . Heart murmur Father   . Pancreatic cancer Father   . Diabetes Father   . Hypertension  Father     Social History   Socioeconomic History  . Marital status: Divorced    Spouse name: Not on file  . Number of children: Not on file  . Years of education: Not on file  . Highest education level: Not on file  Social Needs  . Financial resource strain: Not on file  . Food insecurity - worry: Not on file  . Food insecurity - inability: Not on file  . Transportation needs - medical: Not on file  . Transportation needs - non-medical: Not on file  Occupational History  . Occupation: CNA  Tobacco Use  . Smoking status: Light Tobacco Smoker    Packs/day: 0.33    Years: 29.00    Pack years: 9.57  . Smokeless tobacco: Never Used  Substance and Sexual Activity  . Alcohol use: No  . Drug use: No  . Sexual activity: Not on file  Other Topics Concern  . Not on file  Social History Narrative  . Not on file     Physical Exam: There were no vitals taken for this visit. Constitutional: generally well-appearing Psychiatric: alert and oriented x3 Abdomen: soft, nontender, nondistended, no obvious ascites, no peritoneal signs, normal bowel sounds  No peripheral edema noted in lower extremities  Assessment and plan: 52 y.o. female with incidentally discovered small distal gastric submucosal lesion  For upper EUS today.  Please see the "Patient Instructions" section for addition details about the plan.  Rob Bunting, MD [facility] 09/09/2017, 10:01 AM

## 2017-09-10 ENCOUNTER — Encounter (HOSPITAL_COMMUNITY): Payer: Self-pay | Admitting: Gastroenterology

## 2017-10-01 DIAGNOSIS — Z7984 Long term (current) use of oral hypoglycemic drugs: Secondary | ICD-10-CM | POA: Diagnosis not present

## 2017-10-01 DIAGNOSIS — E119 Type 2 diabetes mellitus without complications: Secondary | ICD-10-CM | POA: Diagnosis not present

## 2017-10-01 DIAGNOSIS — H5213 Myopia, bilateral: Secondary | ICD-10-CM | POA: Diagnosis not present

## 2017-10-12 DIAGNOSIS — L02828 Furuncle of other sites: Secondary | ICD-10-CM | POA: Diagnosis not present

## 2017-10-12 DIAGNOSIS — J9801 Acute bronchospasm: Secondary | ICD-10-CM | POA: Diagnosis not present

## 2017-10-12 DIAGNOSIS — J208 Acute bronchitis due to other specified organisms: Secondary | ICD-10-CM | POA: Diagnosis not present

## 2017-12-27 ENCOUNTER — Other Ambulatory Visit: Payer: Self-pay

## 2017-12-27 DIAGNOSIS — I739 Peripheral vascular disease, unspecified: Secondary | ICD-10-CM

## 2018-01-25 ENCOUNTER — Ambulatory Visit: Payer: BLUE CROSS/BLUE SHIELD | Admitting: Physician Assistant

## 2018-01-25 ENCOUNTER — Ambulatory Visit (HOSPITAL_COMMUNITY)
Admission: RE | Admit: 2018-01-25 | Discharge: 2018-01-25 | Disposition: A | Discharge status: Home / Self Care | Payer: BLUE CROSS/BLUE SHIELD | Source: Ambulatory Visit | Attending: Vascular Surgery | Admitting: Vascular Surgery

## 2018-01-25 ENCOUNTER — Other Ambulatory Visit: Payer: Self-pay

## 2018-01-25 VITALS — BP 178/89 | HR 79 | Temp 99.0°F | Resp 20 | Ht 66.0 in | Wt 245.0 lb

## 2018-01-25 DIAGNOSIS — I739 Peripheral vascular disease, unspecified: Secondary | ICD-10-CM | POA: Diagnosis not present

## 2018-01-25 NOTE — Progress Notes (Signed)
[facility] [place] HISTORY AND PHYSICAL   History of Present Illness:  Patient is a 52 y.o. year old female who presents for evaluation s/p aortobifemoral bypass grafting by Dr. Arbie Cookey on 02/08/17.  She returns today for f/u and repeat ABI's.    She denise symptoms of claudication, non healing wounds and rest pain.  She works full time as a Lawyer.  She is having B LE edema by the end of the day and has been started on HCTZ by her primary physician.    Past medical history includes: HTN, DM, and hypercholesterolemia.     Past Medical History:  Diagnosis Date  . Acne   . Anxiety   . Atopic neurodermatitis   . Cardiomegaly   . Cellulitis of toe of left foot    acute  . Complication of anesthesia    woke up during surgery one  20 some years ago  . COPD (chronic obstructive pulmonary disease) (HCC)   . Diabetic neuropathy (HCC)    fingers  . Dyspnea    with exertion  . Dysthymic disorder   . Epidermal inclusion cyst   . GERD (gastroesophageal reflux disease)    not taking anything  . Heart murmur    told years ago- , no one has said anything in years  . Hyperlipidemia   . Hypertension, benign    chronic  . Obstructive sleep apnea    uses cpap set on 13 uses 1/2 of the tiem   . Type 2 diabetes mellitus without complication (HCC)    chronic    Past Surgical History:  Procedure Laterality Date  . AORTA - BILATERAL FEMORAL ARTERY BYPASS GRAFT N/A 02/08/2017   Procedure: AORTA BIFEMORAL BYPASS GRAFT;  Surgeon: Larina Earthly, MD;  Location: [facility];  Service: Vascular;  Laterality: N/A;  . EUS N/A 09/09/2017   Procedure: UPPER ENDOSCOPIC ULTRASOUND (EUS) LINEAR;  Surgeon: Rachael Fee, MD;  Location: [facility] ENDOSCOPY;  Service: Endoscopy;  Laterality: N/A;  . none    . NOVASURE ABLATION    . SHOULDER SURGERY Left    rotator cuff  . TUBAL LIGATION      ROS:   General:  No weight loss, Fever, chills  HEENT: No recent headaches, no nasal bleeding, no visual  changes, no sore throat  Neurologic: No dizziness, blackouts, seizures. No recent symptoms of stroke or mini- stroke. No recent episodes of slurred speech, or temporary blindness.  Cardiac: No recent episodes of chest pain/pressure, no shortness of breath at rest.  No shortness of breath with exertion.  Denies history of atrial fibrillation or irregular heartbeat  Vascular: No history of rest pain in feet.  No history of claudication.  No history of non-healing ulcer, No history of DVT   Pulmonary: No home oxygen, no productive cough, no hemoptysis,  No asthma or wheezing  Musculoskeletal:   Arthritis,  Low back pain,   Joint pain  Hematologic:No history of hypercoagulable state.  No history of easy bleeding.  No history of anemia  Gastrointestinal: No hematochezia or melena,  No gastroesophageal reflux, no trouble swallowing  Urinary:  chronic Kidney disease,  on HD -  MWF or  TTHS,  Burning with urination,  Frequent urination,  Difficulty urinating;   Skin: No rashes  Psychological: No history of anxiety,  No history of depression  Social History Social History   Tobacco Use  . Smoking status: Light Tobacco  Smoker    Packs/day: 0.33    Years: 29.00    Pack years: 9.57  . Smokeless tobacco: Never Used  . Tobacco comment: 3 cigarettes per day  Substance Use Topics  . Alcohol use: No  . Drug use: No    Family History Family History  Problem Relation Age of Onset  . Hypertension Mother   . Clotting disorder Mother   . Heart murmur Father   . Pancreatic cancer Father   . Diabetes Father   . Hypertension Father     Allergies  Allergies  Allergen Reactions  . No Known Allergies      Current Outpatient Medications  Medication Sig Dispense Refill  . albuterol (PROVENTIL HFA;VENTOLIN HFA) 108 (90 Base) MCG/ACT inhaler Inhale 2 puffs into the lungs every 6 (six) hours as needed for wheezing or shortness of breath.    Marland Kitchen aspirin EC 325 MG  tablet Take 325 mg by mouth 2 (two) times daily.     Marland Kitchen atorvastatin (LIPITOR) 10 MG tablet Take 10 mg by mouth daily.    . carvedilol (COREG) 25 MG tablet Take 25 mg by mouth 2 (two) times daily with a meal.    . citalopram (CELEXA) 20 MG tablet Take 20 mg by mouth daily.    Marland Kitchen gabapentin (NEURONTIN) 300 MG capsule Take 300 mg by mouth 3 (three) times daily.    . hydrochlorothiazide (HYDRODIURIL) 25 MG tablet Take 25 mg by mouth daily.     . metFORMIN (GLUCOPHAGE) 500 MG tablet Take 500 mg by mouth 2 (two) times daily with a meal.    . Olmesartan-Amlodipine-HCTZ (TRIBENZOR) 40-10-25 MG TABS Take 1 tablet by mouth daily.    Marland Kitchen omeprazole (PRILOSEC) 20 MG capsule Take 20 mg by mouth daily.     No current facility-administered medications for this visit.     Physical Examination  Vitals:   01/25/18 1523  BP: (!) 178/89  Pulse: 79  Resp: 20  Temp: 99 F (37.2 C)  TempSrc: Oral  SpO2: 94%  Weight: 245 lb (111.1 kg)  Height:  (1.676 m)    Body mass index is 39.54 kg/m.  General:  Alert and oriented, no acute distress HEENT: Normal, normocephalic Neck: No bruit or JVD Pulmonary: Clear to auscultation bilaterally Cardiac: Regular Rate and Rhythm without murmur Abdomen: Soft, non-tender, non-distended, no mass, no scars Skin: No rash Extremity Pulses:  2+ radial, brachial, femoral, dorsalis pedis, posterior tibial pulses bilaterally Musculoskeletal: No deformity, positive non pitting edema B LE. Neurologic: Upper and lower extremity motor 5/5 and symmetric  DATA:  ABI's Right 0.73 TBI 0.61 Left 0.73 TBI 0.66  Without change from previous ABI's.   ASSESSMENT:  She is now 1 year from aortobifemoral bypass grafting    PLAN:  Patent bypass with palpable pedal pulses. She has put on some weight and is having edema in B LE at the end of the day.  I gave her a hand out for low compression knee high socks to be worn daily in addition to her HCTZ for LE edema.  She will also  elevate her legs when at rest.    She will return in 1 year for repeat ABI's.  If she has problems or concerns prior to this she will call.   Mosetta Pigeon PA-C [facility]  MD in clinic today Early

## 2018-02-15 DIAGNOSIS — E782 Mixed hyperlipidemia: Secondary | ICD-10-CM | POA: Diagnosis not present

## 2018-02-15 DIAGNOSIS — E1142 Type 2 diabetes mellitus with diabetic polyneuropathy: Secondary | ICD-10-CM | POA: Diagnosis not present

## 2018-02-15 DIAGNOSIS — I10 Essential (primary) hypertension: Secondary | ICD-10-CM | POA: Diagnosis not present

## 2018-02-17 DIAGNOSIS — K259 Gastric ulcer, unspecified as acute or chronic, without hemorrhage or perforation: Secondary | ICD-10-CM

## 2018-02-17 DIAGNOSIS — G4733 Obstructive sleep apnea (adult) (pediatric): Secondary | ICD-10-CM

## 2018-02-17 DIAGNOSIS — I517 Cardiomegaly: Secondary | ICD-10-CM | POA: Insufficient documentation

## 2018-02-17 DIAGNOSIS — D649 Anemia, unspecified: Secondary | ICD-10-CM

## 2018-02-17 DIAGNOSIS — K219 Gastro-esophageal reflux disease without esophagitis: Secondary | ICD-10-CM | POA: Insufficient documentation

## 2018-02-17 HISTORY — DX: Anemia, unspecified: D64.9

## 2018-02-17 HISTORY — DX: Gastric ulcer, unspecified as acute or chronic, without hemorrhage or perforation: K25.9

## 2018-02-17 HISTORY — DX: Obstructive sleep apnea (adult) (pediatric): G47.33

## 2018-03-02 DIAGNOSIS — E1159 Type 2 diabetes mellitus with other circulatory complications: Secondary | ICD-10-CM | POA: Diagnosis not present

## 2018-03-15 DIAGNOSIS — I351 Nonrheumatic aortic (valve) insufficiency: Secondary | ICD-10-CM | POA: Insufficient documentation

## 2018-03-15 DIAGNOSIS — I340 Nonrheumatic mitral (valve) insufficiency: Secondary | ICD-10-CM | POA: Insufficient documentation

## 2018-03-15 NOTE — Progress Notes (Signed)
Cardiology Office Note:    Date:  03/16/2018   ID:  Lori Carter, DOB 1966/08/01, MRN [id number]  PCP:  Abigail MiyamotoPerry, Lawrence Edward, MD  Cardiologist:  Norman HerrlichBrian Munley, MD   Referring MD: Abigail MiyamotoPerry, Lawrence Edward,*  ASSESSMENT:    1. 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 Nonrheumatic aortic valve insufficiency   3. Non-rheumatic mitral regurgitation   4. Type 2 diabetes mellitus with other specified complication, without long-term current use of insulin (HCC)    PLAN:    In order of problems listed above:  1. She is decompensated symptomatic marked volume overload class III and will initiate a potent loop diuretic Demadex and if she fails to respond will require admission IV diuretic.  With her clinical deterioration laboratory studies are performed including a CBC she has a background history of anemia proBNP level to confirm heart failure renal function and echocardiogram to look at systolic diastolic function and pulmonary artery pressures.  Her sodium retention has been potentiated by her calcium channel blocker was discontinued I would discontinue her gabapentin but she told me that at this time is necessary for pain relief.  Other modalities should be considered.  She will follow back in my office in 2 weeks.  He was instructed and is stressed the need for compliance and sodium restriction and daily weights for home self management. 2. Recheck echocardiogram 3. Recheck echocardiogram. 4. Performance problematic with heart failure she would benefit from other class of drug especially SGLT2 inhibitors with her benefit for cardiovascular complications and particularly heart failure with type 2 diabetes  Next appointment 2 weeks   Medication Adjustments/Labs and Tests Ordered: Current medicines are reviewed at length with the patient today.  Concerns regarding medicines are outlined above.  Orders Placed This Encounter  Procedures  .  Basic metabolic panel  . Pro b natriuretic peptide (BNP)  . CBC  . EKG 12-Lead  . ECHOCARDIOGRAM COMPLETE   Meds ordered this encounter  Medications  . torsemide (DEMADEX) 20 MG tablet    Sig: Take 1 tablet (20 mg total) by mouth daily.    Dispense:  180 tablet    Refill:  3    Patient will take 20mg  BID for 2 days, then 20mg  daily as instructed  . olmesartan (BENICAR) 40 MG tablet    Sig: Take 1 tablet (40 mg total) by mouth daily.    Dispense:  30 tablet    Refill:  3     Chief Complaint  Patient presents with  . Hypertension    with LVH and cardiomegaly    History of Present Illness:    Lori LaineSonya Carter is a 52 y.o. female with hypertension hyperlipidemia type 2 diabetes peripheral arterial disease with aortobifemoral bypass surgery who is being seen today for the evaluation of cardiomegaly at the request of Abigail Miyamotoerry, Lawrence Edward,*. She was seen by Dr. Anne FuSkains 04/23/2016 for cardiomyopathy.  Echocardiogram showed normal normal left ventricular size moderate left ventricular hypertrophy normal systolic function and diastolic pressures there is mild to moderate aortic regurgitation mild mitral regurgitation.  Had a slow steady deterioration over X months.  She has 2 pillow orthopnea she is at a few episodes of PND she is gained 20 pounds or more and she is developed marked edema including facial swelling.  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  She does sodium restrict she takes 2 agents potentiating sodium retention including gabapentin and calcium channel blocker amlodipine.  She is on metformin ideally this should be switched and the new SGLT2 agents would be ideal with heart failure  Past Medical History:  Diagnosis Date  . Acne   . Anemia 02/17/2018  . Anxiety   . Aortic occlusion (HCC) 02/08/2017  . Atopic neurodermatitis   . Cardiomegaly   . Cardiomyopathy  (HCC) 04/23/2016  . Cellulitis of toe of left foot    acute  . Complication of anesthesia    woke up during surgery one  20 some years ago  . COPD (chronic obstructive pulmonary disease) (HCC)   . Diabetic neuropathy (HCC)    fingers  . Dyspnea    with exertion  . Dysthymic disorder   . Epidermal inclusion cyst   . Essential hypertension 04/23/2016  . Gastric mass   . Gastric ulcer 02/17/2018  . GERD (gastroesophageal reflux disease)    not taking anything  . Heart murmur    told years ago- , no one has said anything in years  . Hyperlipidemia   . Hypertension, benign    chronic  . Obesity 04/23/2016  . Obstructive sleep apnea    uses cpap set on 13 uses 1/2 of the tiem   . OSA (obstructive sleep apnea) 02/17/2018  . Tobacco abuse 04/23/2016  . Type 2 diabetes mellitus without complication (HCC)    chronic    Past Surgical History:  Procedure Laterality Date  . AORTA - BILATERAL FEMORAL ARTERY BYPASS GRAFT N/A 02/08/2017   Procedure: AORTA BIFEMORAL BYPASS GRAFT;  Surgeon: Larina Earthly, MD;  Location: [facility] OR;  Service: Vascular;  Laterality: N/A;  . EUS N/A 09/09/2017   Procedure: UPPER ENDOSCOPIC ULTRASOUND (EUS) LINEAR;  Surgeon: Rachael Fee, MD;  Location: [facility] ENDOSCOPY;  Service: Endoscopy;  Laterality: N/A;  . none    . NOVASURE ABLATION    . SHOULDER SURGERY Left    rotator cuff  . TUBAL LIGATION      Current Medications: Current Meds  Medication Sig  . albuterol (PROVENTIL HFA;VENTOLIN HFA) 108 (90 Base) MCG/ACT inhaler Inhale 2 puffs into the lungs every 6 (six) hours as needed for wheezing or shortness of breath.  Marland Kitchen aspirin EC 325 MG tablet Take 325 mg by mouth 2 (two) times daily.   Marland Kitchen atorvastatin (LIPITOR) 10 MG tablet Take 10 mg by mouth daily.  . carvedilol (COREG) 25 MG tablet Take 25 mg by mouth 2 (two) times daily with a meal.  . citalopram (CELEXA) 20 MG tablet Take 20 mg by mouth daily.  Marland Kitchen gabapentin (NEURONTIN) 300 MG capsule Take 300 mg by mouth 3  (three) times daily.  . metFORMIN (GLUCOPHAGE) 500 MG tablet Take 500 mg by mouth 2 (two) times daily with a meal.  . omeprazole (PRILOSEC) 20 MG capsule Take 20 mg by mouth daily.  . [DISCONTINUED] hydrochlorothiazide (HYDRODIURIL) 25 MG tablet Take 25 mg by mouth daily.   . [DISCONTINUED] Olmesartan-Amlodipine-HCTZ (TRIBENZOR) 40-10-25 MG TABS Take 1 tablet by mouth daily.     Allergies:   No known allergies   Social History   Socioeconomic History  . Marital status: Divorced    Spouse name: Not on file  . Number of children: Not on file  . Years of education: Not on file  . Highest education level: Not on file  Occupational History  . Occupation: CNA  Social Needs  . Financial resource strain:  Not on file  . Food insecurity:    Worry: Not on file    Inability: Not on file  . Transportation needs:    Medical: Not on file    Non-medical: Not on file  Tobacco Use  . Smoking status: Light Tobacco Smoker    Packs/day: 0.33    Years: 29.00    Pack years: 9.57  . Smokeless tobacco: Never Used  . Tobacco comment: 3 cigarettes per day  Substance and Sexual Activity  . Alcohol use: No  . Drug use: No  . Sexual activity: Not on file  Lifestyle  . Physical activity:    Days per week: Not on file    Minutes per session: Not on file  . Stress: Not on file  Relationships  . Social connections:    Talks on phone: Not on file    Gets together: Not on file    Attends religious service: Not on file    Active member of club or organization: Not on file    Attends meetings of clubs or organizations: Not on file    Relationship status: Not on file  Other Topics Concern  . Not on file  Social History Narrative  . Not on file     Family History: The patient's family history includes Clotting disorder in her mother; Diabetes in her father; Heart murmur in her father; Hypertension in her father and mother; Pancreatic cancer in her father.  ROS:   ROS Please see the history of  present illness.     All other systems reviewed and are negative.  EKGs/Labs/Other Studies Reviewed:    The following studies were reviewed today: Previous records including Dr. Anne Fu office consult reviewed  EKG:  EKG is  ordered today.  The ekg ordered today demonstrates sinus rhythm no ischemic change  Recent Labs: From 06/25/2017 cholesterol 121 HDL 42 LDL 65 A1c 6.7%.  Renal function was normal at that time. No results found for requested labs within last 8760 hours.  Recent Lipid Panel No results found for: CHOL, TRIG, HDL, CHOLHDL, VLDL, LDLCALC, LDLDIRECT  Physical Exam:    VS:  BP 134/76 (BP Location: Right Arm, Patient Position: Sitting, Cuff Size: Normal)   Pulse 75   Ht 5\' 6"  (1.676 m)   Wt 249 lb (112.9 kg)   SpO2 95%   BMI 40.19 kg/m     Wt Readings from Last 3 Encounters:  03/16/18 249 lb (112.9 kg)  01/25/18 245 lb (111.1 kg)  09/09/17 232 lb (105.2 kg)     GEN:  Well nourished, well developed in no acute distress HEENT: Normal NECK: No JVD; No carotid bruits LYMPHATICS: No lymphadenopathy CARDIAC: RRR, no murmurs, rubs, gallops RESPIRATORY:  Clear to auscultation without rales, wheezing or rhonchi  ABDOMEN: Soft, non-tender, non-distended MUSCULOSKELETAL:  4+ bilateral to the knee edema; No deformity  SKIN: Warm and dry NEUROLOGIC:  Alert and oriented x 3 PSYCHIATRIC:  Normal affect     Signed, Norman Herrlich, MD  03/16/2018 8:08 PM    [facility]

## 2018-03-16 ENCOUNTER — Encounter: Payer: Self-pay | Admitting: Cardiology

## 2018-03-16 ENCOUNTER — Ambulatory Visit: Payer: BLUE CROSS/BLUE SHIELD | Admitting: Cardiology

## 2018-03-16 VITALS — BP 134/76 | HR 75 | Ht 66.0 in | Wt 249.0 lb

## 2018-03-16 DIAGNOSIS — I5033 Acute on chronic diastolic (congestive) heart failure: Secondary | ICD-10-CM | POA: Diagnosis not present

## 2018-03-16 DIAGNOSIS — E1169 Type 2 diabetes mellitus with other specified complication: Secondary | ICD-10-CM | POA: Diagnosis not present

## 2018-03-16 DIAGNOSIS — I110 Hypertensive heart disease with heart failure: Secondary | ICD-10-CM | POA: Diagnosis not present

## 2018-03-16 DIAGNOSIS — I340 Nonrheumatic mitral (valve) insufficiency: Secondary | ICD-10-CM | POA: Diagnosis not present

## 2018-03-16 DIAGNOSIS — I351 Nonrheumatic aortic (valve) insufficiency: Secondary | ICD-10-CM | POA: Diagnosis not present

## 2018-03-16 DIAGNOSIS — I119 Hypertensive heart disease without heart failure: Secondary | ICD-10-CM | POA: Diagnosis not present

## 2018-03-16 DIAGNOSIS — E119 Type 2 diabetes mellitus without complications: Secondary | ICD-10-CM | POA: Insufficient documentation

## 2018-03-16 MED ORDER — OLMESARTAN MEDOXOMIL 40 MG PO TABS: 40.0000 mg | ORAL_TABLET | Freq: Every day | ORAL | 3 refills | Status: DC

## 2018-03-16 MED ORDER — TORSEMIDE 20 MG PO TABS: 20.0000 mg | ORAL_TABLET | Freq: Every day | ORAL | 3 refills | Status: DC

## 2018-03-16 NOTE — Patient Instructions (Addendum)
Medication Instructions:  Your physician has recommended you make the following change in your medication:  STOP: tribenzor STOP: hctz Start: toresmide 20mg  twice daily for 2 days, then change to once daily  START: olmesartan 40mg  once daily   Labwork: Your physician recommends that you have the following labs drawn: BMP, BNP, CBC   Testing/Procedures: Your physician has requested that you have an echocardiogram. Echocardiography is a painless test that uses sound waves to create images of your heart. It provides your doctor with information about the size and shape of your heart and how well your heart's chambers and valves are working. This procedure takes approximately one hour. There are no restrictions for this procedure.    Follow-Up: Your physician recommends that you schedule a follow-up appointment in: 2 weeks    Any Other Special Instructions Will Be Listed Below (If Applicable).     If you need a refill on your cardiac medications before your next appointment, please call your pharmacy.     Heart Failure  Weigh yourself every morning when you first wake up and record on a calender or note pad, bring this to your office visits. Using a pill tender can help with taking your medications consistently.  Limit your fluid intake to 2 liters daily  Limit your sodium intake to less than 2-3 grams daily. Ask if you need dietary teaching.  If you gain more than 3 pounds (from your dry weight ), double your dose of diuretic for the day.  If you gain more than 5 pounds (from your dry weight), double your dose of lasix and call your heart failure doctor.  Please do not smoke tobacco since it is very bad for your heart.  Please do not drink alcohol since it can worsen your heart failure.Also avoid OTC nonsteroidal drugs, such as advil, aleve and motrin.  Try to exercise for at least 30 minutes every day because this will help your heart be more efficient. You may be  eligible for supervised cardiac rehab, ask your physician.  Olmesartan tablets What is this medicine? OLMESARTAN (all mi SAR tan) is used to treat high blood pressure. This medicine may be used for other purposes; ask your health care provider or pharmacist if you have questions. COMMON BRAND NAME(S): Benicar What should I tell my health care provider before I take this medicine? They need to know if you have any of these conditions: -if you are on a special diet, such as a low-salt diet -kidney or liver disease -an unusual or allergic reaction to olmesartan, other medicines, foods, dyes, or preservatives -pregnant or trying to get pregnant -breast-feeding How should I use this medicine? Take this medicine by mouth with a glass of water. Follow the directions on the prescription label. This medicine can be taken with or without food. Take your doses at regular intervals. Do not take your medicine more often than directed. Do not stop taking except on the advice of your doctor or health care professional. Talk to your pediatrician regarding the use of this medicine in children. While this drug may be prescribed for children as young as 6 years for selected conditions, precautions do apply. Overdosage: If you think you have taken too much of this medicine contact a poison control center or emergency room at once. NOTE: This medicine is only for you. Do not share this medicine with others. What if I miss a dose? If you miss a dose, take it as soon as you can. If it  is almost time for your next dose, take only that dose. Do not take double or extra doses. What may interact with this medicine? -blood pressure medicines -diuretics, especially triamterene, spironolactone or amiloride -potassium salts or potassium supplements This list may not describe all possible interactions. Give your health care provider a list of all the medicines, herbs, non-prescription drugs, or dietary supplements you use.  Also tell them if you smoke, drink alcohol, or use illegal drugs. Some items may interact with your medicine. What should I watch for while using this medicine? Visit your doctor or health care professional for regular checks on your progress. Check your blood pressure as directed. Ask your doctor or health care professional what your blood pressure should be and when you should contact him or her. Call your doctor or health care professional if you notice an irregular or fast heart beat. Women should inform their doctor if they wish to become pregnant or think they might be pregnant. There is a potential for serious side effects to an unborn child, particularly in the second or third trimester. Talk to your health care professional or pharmacist for more information. You may get drowsy or dizzy. Do not drive, use machinery, or do anything that needs mental alertness until you know how this drug affects you. Do not stand or sit up quickly, especially if you are an older patient. This reduces the risk of dizzy or fainting spells. Alcohol can make you more drowsy and dizzy. Avoid alcoholic drinks. Avoid salt substitutes unless you are told otherwise by your doctor or health care professional. Do not treat yourself for coughs, colds, or pain while you are taking this medicine without asking your doctor or health care professional for advice. Some ingredients may increase your blood pressure. What side effects may I notice from receiving this medicine? Side effects that you should report to your doctor or health care professional as soon as possible: -confusion, dizziness, light headedness or fainting spells -decreased amount of urine passed -diarrhea -difficulty breathing or swallowing, hoarseness, or tightening of the throat -fast or irregular heart beat, palpitations, or chest pain -skin rash, itching -swelling of your face, lips, tongue, hands, or feet -vomiting -weight loss Side effects that  usually do not require medical attention (report to your doctor or health care professional if they continue or are bothersome): -cough -decreased sexual function or desire -headache -nasal congestion or stuffiness -nausea -sore or cramping muscles This list may not describe all possible side effects. Call your doctor for medical advice about side effects. You may report side effects to FDA at 1-800-FDA-1088. Where should I keep my medicine? Keep out of the reach of children. Store your medicine at room temperature between 20 and 25 degrees C (68 and 77 degrees F). Throw away any unused medicine after the expiration date. NOTE: This sheet is a summary. It may not cover all possible information. If you have questions about this medicine, talk to your doctor, pharmacist, or health care provider.  2018 Elsevier/Gold Standard (2012-03-02 13:02:23) Torsemide tablets What is this medicine? TORSEMIDE (TORE se mide) is a diuretic. It helps you make more urine and to lose salt and excess water from your body. This medicine is used to treat high blood pressure, and edema or swelling from heart, kidney, or liver disease. This medicine may be used for other purposes; ask your health care provider or pharmacist if you have questions. COMMON BRAND NAME(S): Demadex What should I tell my health care provider before I  take this medicine? They need to know if you have any of these conditions: -abnormal blood electrolytes -diabetes -gout -heart disease -kidney disease -liver disease -small amounts of urine, or difficulty passing urine -an unusual or allergic reaction to torsemide, sulfa drugs, other medicines, foods, dyes, or preservatives -pregnant or trying to get pregnant -breast-feeding How should I use this medicine? Take this medicine by mouth with a glass of water. Follow the directions on the prescription label. You may take this medicine with or without food. If it upsets your stomach, take it  with food or milk. Do not take your medicine more often than directed. Remember that you will need to pass more urine after taking this medicine. Do not take your medicine at a time of day that will cause you problems. Do not take at bedtime. Talk to your pediatrician regarding the use of this medicine in children. Special care may be needed. Overdosage: If you think you have taken too much of this medicine contact a poison control center or emergency room at once. NOTE: This medicine is only for you. Do not share this medicine with others. What if I miss a dose? If you miss a dose, take it as soon as you can. If it is almost time for your next dose, take only that dose. Do not take double or extra doses. What may interact with this medicine? -alcohol -certain antibiotics given by injection certain heart medicines like digoxin -diuretics -lithium -medicines for diabetes -medicines for blood pressure -medicines for cholesterol like cholestyramine -medicines that relax muscles for surgery -NSAIDs, medicines for pain and inflammation, like ibuprofen or naproxen -OTC supplements like ginseng and ephedra -probenecid -steroid medicines like prednisone or cortisone This list may not describe all possible interactions. Give your health care provider a list of all the medicines, herbs, non-prescription drugs, or dietary supplements you use. Also tell them if you smoke, drink alcohol, or use illegal drugs. Some items may interact with your medicine. What should I watch for while using this medicine? Visit your doctor or health care professional for regular checks on your progress. Check your blood pressure regularly. Ask your doctor or health care professional what your blood pressure should be, and when you should contact him or her. If you are a diabetic, check your blood sugar as directed. You may need to be on a special diet while taking this medicine. Check with your doctor. Also, ask how many  glasses of fluid you need to drink a day. You must not get dehydrated. You may get drowsy or dizzy. Do not drive, use machinery, or do anything that needs mental alertness until you know how this drug affects you. Do not stand or sit up quickly, especially if you are an older patient. This reduces the risk of dizzy or fainting spells. Alcohol can make you more drowsy and dizzy. Avoid alcoholic drinks. What side effects may I notice from receiving this medicine? Side effects that you should report to your doctor or health care professional as soon as possible: -allergic reactions such as skin rash or itching, hives, swelling of the lips, mouth, tongue or throat -blood in urine or stool -dry mouth -hearing loss or ringing in the ears -irregular heartbeat -muscle pain, weakness or cramps -pain or difficulty passing urine -unusually weak or tired -vomiting or diarrhea Side effects that usually do not require medical attention (report to your doctor or health care professional if they continue or are bothersome): -dizzy or lightheaded -headache -increased thirst -passing  large amounts of urine -sexual difficulties -stomach pain, upset or nausea This list may not describe all possible side effects. Call your doctor for medical advice about side effects. You may report side effects to FDA at 1-800-FDA-1088. Where should I keep my medicine? Keep out of the reach of children. Store at room temperature between 15 and 30 degrees C (59 and 86 degrees F). Throw away any unused medicine after the expiration date. NOTE: This sheet is a summary. It may not cover all possible information. If you have questions about this medicine, talk to your doctor, pharmacist, or health care provider.  2018 Elsevier/Gold Standard (2008-05-03 11:35:45)

## 2018-03-17 ENCOUNTER — Other Ambulatory Visit: Payer: Self-pay | Admitting: Cardiology

## 2018-03-17 ENCOUNTER — Telehealth: Payer: Self-pay

## 2018-03-17 DIAGNOSIS — I110 Hypertensive heart disease with heart failure: Secondary | ICD-10-CM

## 2018-03-17 DIAGNOSIS — I5033 Acute on chronic diastolic (congestive) heart failure: Principal | ICD-10-CM

## 2018-03-17 LAB — CBC
Hematocrit: 36.1 % (ref 34.0–46.6)
Hemoglobin: 11.9 g/dL (ref 11.1–15.9)
MCH: 27.6 pg (ref 26.6–33.0)
MCHC: 33 g/dL (ref 31.5–35.7)
MCV: 84 fL (ref 79–97)
Platelets: 203 10*3/uL (ref 150–450)
RBC: 4.31 x10E6/uL (ref 3.77–5.28)
RDW: 16.9 % — ABNORMAL HIGH (ref 12.3–15.4)
WBC: 5.7 10*3/uL (ref 3.4–10.8)

## 2018-03-17 LAB — BASIC METABOLIC PANEL
BUN/Creatinine Ratio: 15 (ref 9–23)
BUN: 13 mg/dL (ref 6–24)
CALCIUM: 9 mg/dL (ref 8.7–10.2)
CO2: 26 mmol/L (ref 20–29)
CREATININE: 0.86 mg/dL (ref 0.57–1.00)
Chloride: 101 mmol/L (ref 96–106)
GFR calc Af Amer: 89 mL/min/{1.73_m2} (ref >59)
GFR, EST NON AFRICAN AMERICAN: 77 mL/min/{1.73_m2} (ref >59)
Glucose: 122 mg/dL — ABNORMAL HIGH (ref 65–99)
Potassium: 3.4 mmol/L — ABNORMAL LOW (ref 3.5–5.2)
Sodium: 143 mmol/L (ref 134–144)

## 2018-03-17 MED ORDER — POTASSIUM CHLORIDE CRYS ER 20 MEQ PO TBCR: 20.0000 meq | EXTENDED_RELEASE_TABLET | Freq: Two times a day (BID) | ORAL | 0 refills | Status: DC

## 2018-03-17 NOTE — Telephone Encounter (Signed)
Left message for patient to return call for lab results. 

## 2018-03-18 ENCOUNTER — Telehealth: Payer: Self-pay

## 2018-03-18 MED ORDER — TELMISARTAN 40 MG PO TABS: 40.0000 mg | ORAL_TABLET | Freq: Every day | ORAL | 3 refills | Status: DC

## 2018-03-18 NOTE — Telephone Encounter (Signed)
-----   Message from Baldo DaubBrian J Munley, MD sent at 03/17/2018  7:51 AM EDT ----- Low potassium  I sent for K 20 meq bid

## 2018-03-18 NOTE — Telephone Encounter (Signed)
Patient notified of lab results with low potassium per Dr Dulce SellarMunley.  Patient advised to start potassium 20 meq twice daily.    We also received fax from pharmacy, stating that olmesartan is on backorder.  Dr Dulce SellarMunley advised to change to Micardis (telmisartan) 40mg  one tablet daily.  Patient made aware of medication changes when called with the lab results.    Patient verbalized understanding.

## 2018-03-18 NOTE — Telephone Encounter (Signed)
Patient notified of lab results with low potassium per Dr Munley.  Patient advised to start potassium 20 meq twice daily.    We also received fax from pharmacy, stating that olmesartan is on backorder.  Dr Munley advised to change to Micardis (telmisartan) 40mg one tablet daily.  Patient made aware of medication changes when called with the lab results.    Patient verbalized understanding.   

## 2018-03-22 ENCOUNTER — Ambulatory Visit (HOSPITAL_COMMUNITY): Payer: BLUE CROSS/BLUE SHIELD | Attending: Cardiology

## 2018-03-22 DIAGNOSIS — I5033 Acute on chronic diastolic (congestive) heart failure: Secondary | ICD-10-CM | POA: Insufficient documentation

## 2018-03-22 DIAGNOSIS — J449 Chronic obstructive pulmonary disease, unspecified: Secondary | ICD-10-CM | POA: Diagnosis not present

## 2018-03-22 DIAGNOSIS — E785 Hyperlipidemia, unspecified: Secondary | ICD-10-CM | POA: Insufficient documentation

## 2018-03-22 DIAGNOSIS — E669 Obesity, unspecified: Secondary | ICD-10-CM | POA: Diagnosis not present

## 2018-03-22 DIAGNOSIS — I351 Nonrheumatic aortic (valve) insufficiency: Secondary | ICD-10-CM

## 2018-03-22 DIAGNOSIS — I340 Nonrheumatic mitral (valve) insufficiency: Secondary | ICD-10-CM

## 2018-03-22 DIAGNOSIS — E119 Type 2 diabetes mellitus without complications: Secondary | ICD-10-CM | POA: Diagnosis not present

## 2018-03-22 DIAGNOSIS — G4733 Obstructive sleep apnea (adult) (pediatric): Secondary | ICD-10-CM | POA: Diagnosis not present

## 2018-03-22 DIAGNOSIS — F17210 Nicotine dependence, cigarettes, uncomplicated: Secondary | ICD-10-CM | POA: Insufficient documentation

## 2018-03-22 DIAGNOSIS — Z6841 Body Mass Index (BMI) 40.0 and over, adult: Secondary | ICD-10-CM | POA: Diagnosis not present

## 2018-03-22 DIAGNOSIS — I080 Rheumatic disorders of both mitral and aortic valves: Secondary | ICD-10-CM | POA: Diagnosis not present

## 2018-03-22 DIAGNOSIS — I110 Hypertensive heart disease with heart failure: Secondary | ICD-10-CM | POA: Diagnosis not present

## 2018-03-30 NOTE — Progress Notes (Signed)
Cardiology Office Note:    Date:  03/31/2018   ID:  Lori Carter, DOB Jan 03, 1966, MRN [id number]  PCP:  Abigail Miyamoto, MD  Cardiologist:  Norman Herrlich, MD    Referring MD: Abigail Miyamoto,*    ASSESSMENT:    1. 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 Hypokalemia   3. Nonrheumatic aortic valve insufficiency    PLAN:    In order of problems listed above:  1. Improved she is still edematous will increase the dose of her diuretic and keep it twice daily.  Recheck renal function with previous low potassium and proBNP level. 2. Recheck potassium today continue supplements 3. Stable on recent cardiac echo   Next appointment: One month   Medication Adjustments/Labs and Tests Ordered: Current medicines are reviewed at length with the patient today.  Concerns regarding medicines are outlined above.  Orders Placed This Encounter  Procedures  . Basic Metabolic Panel (BMET)  . Pro b natriuretic peptide (BNP)   Meds ordered this encounter  Medications  . torsemide (DEMADEX) 20 MG tablet    Sig: Take 1 tablet (20 mg total) by mouth 2 (two) times daily.    Dispense:  360 tablet    Refill:  3    Patient will take 20mg  BID for 2 days, then 20mg  daily as instructed  . aspirin EC 81 MG EC tablet    Sig: Take 1 tablet (81 mg total) by mouth 2 (two) times daily.    Dispense:  30 tablet    Refill:  3    Chief Complaint  Patient presents with  . Congestive Heart Failure    History of Present Illness:    Lori Carter is a 52 y.o. female with a hx of cardiomyopathy, hypertension hyperlipidemia type 2 diabetes peripheral arterial disease with aortobifemoral bypass surgery  last seen 03/16/18 with decompensated heart failure. Compliance with diet, lifestyle and medications: Yes Her weight dropped 9 pounds but she still has edema and mild exertional dyspnea but no orthopnea PND chest pain palpitation or syncope.   She is serious about weighing every day sodium restricting and she is actually walking 15 to 20 minutes/day Past Medical History:  Diagnosis Date  . Acne   . Anemia 02/17/2018  . Anxiety   . Aortic occlusion (HCC) 02/08/2017  . Atopic neurodermatitis   . Cardiomegaly   . Cardiomyopathy (HCC) 04/23/2016  . Cellulitis of toe of left foot    acute  . Complication of anesthesia    woke up during surgery one  20 some years ago  . COPD (chronic obstructive pulmonary disease) (HCC)   . Diabetic neuropathy (HCC)    fingers  . Dyspnea    with exertion  . Dysthymic disorder   . Epidermal inclusion cyst   . Essential hypertension 04/23/2016  . Gastric mass   . Gastric ulcer 02/17/2018  . GERD (gastroesophageal reflux disease)    not taking anything  . Heart murmur    told years ago- , no one has said anything in years  . Hyperlipidemia   . Hypertension, benign    chronic  . Obesity 04/23/2016  . Obstructive sleep apnea    uses cpap set on 13 uses 1/2 of the tiem   . OSA (obstructive sleep apnea) 02/17/2018  . Tobacco abuse 04/23/2016  . Type 2 diabetes mellitus without complication (HCC)    chronic    Past Surgical History:  Procedure Laterality Date  .  AORTA - BILATERAL FEMORAL ARTERY BYPASS GRAFT N/A 02/08/2017   Procedure: AORTA BIFEMORAL BYPASS GRAFT;  Surgeon: Larina EarthlyEarly, Todd F, MD;  [facility];  Service: Vascular;  Laterality: N/A;  . EUS N/A 09/09/2017   Procedure: UPPER ENDOSCOPIC ULTRASOUND (EUS) LINEAR;  Surgeon: Rachael FeeJacobs, Daniel P, MD;  Location: [facility] ENDOSCOPY;  Service: Endoscopy;  Laterality: N/A;  . none    . NOVASURE ABLATION    . SHOULDER SURGERY Left    rotator cuff  . TUBAL LIGATION      Current Medications: Current Meds  Medication Sig  . albuterol (PROVENTIL HFA;VENTOLIN HFA) 108 (90 Base) MCG/ACT inhaler Inhale 2 puffs into the lungs every 6 (six) hours as needed for wheezing or shortness of breath.  Marland Kitchen. aspirin EC 81 MG EC tablet Take 1 tablet (81 mg total) by  mouth 2 (two) times daily.  Marland Kitchen. atorvastatin (LIPITOR) 10 MG tablet Take 10 mg by mouth daily.  . carvedilol (COREG) 25 MG tablet Take 25 mg by mouth 2 (two) times daily with a meal.  . citalopram (CELEXA) 20 MG tablet Take 20 mg by mouth daily.  Marland Kitchen. gabapentin (NEURONTIN) 300 MG capsule Take 300 mg by mouth as needed.   . metFORMIN (GLUCOPHAGE) 500 MG tablet Take 500 mg by mouth 2 (two) times daily with a meal.  . omeprazole (PRILOSEC) 20 MG capsule Take 20 mg by mouth daily.  . potassium chloride SA (K-DUR,KLOR-CON) 20 MEQ tablet Take 1 tablet (20 mEq total) by mouth 2 (two) times daily.  Marland Kitchen. telmisartan (MICARDIS) 40 MG tablet Take 1 tablet (40 mg total) by mouth daily.  Marland Kitchen. torsemide (DEMADEX) 20 MG tablet Take 1 tablet (20 mg total) by mouth 2 (two) times daily.  . [DISCONTINUED] aspirin EC 325 MG tablet Take 325 mg by mouth 2 (two) times daily.   . [DISCONTINUED] torsemide (DEMADEX) 20 MG tablet Take 1 tablet (20 mg total) by mouth daily.     Allergies:   No known allergies   Social History   Socioeconomic History  . Marital status: Divorced    Spouse name: Not on file  . Number of children: Not on file  . Years of education: Not on file  . Highest education level: Not on file  Occupational History  . Occupation: CNA  Social Needs  . Financial resource strain: Not on file  . Food insecurity:    Worry: Not on file    Inability: Not on file  . Transportation needs:    Medical: Not on file    Non-medical: Not on file  Tobacco Use  . Smoking status: Light Tobacco Smoker    Packs/day: 0.33    Years: 29.00    Pack years: 9.57  . Smokeless tobacco: Never Used  . Tobacco comment: 3 cigarettes per day  Substance and Sexual Activity  . Alcohol use: No  . Drug use: No  . Sexual activity: Not on file  Lifestyle  . Physical activity:    Days per week: Not on file    Minutes per session: Not on file  . Stress: Not on file  Relationships  . Social connections:    Talks on phone:  Not on file    Gets together: Not on file    Attends religious service: Not on file    Active member of club or organization: Not on file    Attends meetings of clubs or organizations: Not on file    Relationship status: Not on file  Other Topics  Concern  . Not on file  Social History Narrative  . Not on file     Family History: The patient's family history includes Clotting disorder in her mother; Diabetes in her father; Heart murmur in her father; Hypertension in her father and mother; Pancreatic cancer in her father. ROS:   Please see the history of present illness.    All other systems reviewed and are negative.  EKGs/Labs/Other Studies Reviewed:    The following studies were reviewed today: Echo with moderate to severe LVH normal EF% and mild to moderate AR  Recent Labs: 03/16/2018: BUN 13; Creatinine, Ser 0.86; Hemoglobin 11.9; Platelets 203; Potassium 3.4; Sodium 143  Recent Lipid Panel No results found for: CHOL, TRIG, HDL, CHOLHDL, VLDL, LDLCALC, LDLDIRECT  Physical Exam:    VS:  BP (!) 150/80 (BP Location: Right Arm, Patient Position: Sitting, Cuff Size: Normal)   Pulse 88   Ht 5\' 6"  (1.676 m)   Wt 248 lb (112.5 kg)   SpO2 96%   BMI 40.03 kg/m     Wt Readings from Last 3 Encounters:  03/31/18 248 lb (112.5 kg)  03/16/18 249 lb (112.9 kg)  01/25/18 245 lb (111.1 kg)     GEN:  Well nourished, well developed in no acute distress HEENT: Normal NECK: No JVD; No carotid bruits LYMPHATICS: No lymphadenopathy CARDIAC: No S3 RRR, no murmurs, rubs, gallops RESPIRATORY:  Clear to auscultation without rales, wheezing or rhonchi  ABDOMEN: Soft, non-tender, non-distended MUSCULOSKELETAL: 1-2+ bilateral to the knee edema; No deformity  SKIN: Warm and dry NEUROLOGIC:  Alert and oriented x 3 PSYCHIATRIC:  Normal affect    Signed, Norman Herrlich, MD  03/31/2018 1:05 PM    [facility]

## 2018-03-31 ENCOUNTER — Encounter: Payer: Self-pay | Admitting: Cardiology

## 2018-03-31 ENCOUNTER — Ambulatory Visit (INDEPENDENT_AMBULATORY_CARE_PROVIDER_SITE_OTHER): Payer: BLUE CROSS/BLUE SHIELD | Admitting: Cardiology

## 2018-03-31 VITALS — BP 150/80 | HR 88 | Ht 66.0 in | Wt 248.0 lb

## 2018-03-31 DIAGNOSIS — I110 Hypertensive heart disease with heart failure: Secondary | ICD-10-CM | POA: Diagnosis not present

## 2018-03-31 DIAGNOSIS — E876 Hypokalemia: Secondary | ICD-10-CM

## 2018-03-31 DIAGNOSIS — I5033 Acute on chronic diastolic (congestive) heart failure: Secondary | ICD-10-CM

## 2018-03-31 DIAGNOSIS — I351 Nonrheumatic aortic (valve) insufficiency: Secondary | ICD-10-CM | POA: Diagnosis not present

## 2018-03-31 LAB — BASIC METABOLIC PANEL
BUN / CREAT RATIO: 11 (ref 9–23)
BUN: 12 mg/dL (ref 6–24)
CALCIUM: 9 mg/dL (ref 8.7–10.2)
CO2: 27 mmol/L (ref 20–29)
CREATININE: 1.08 mg/dL — AB (ref 0.57–1.00)
Chloride: 98 mmol/L (ref 96–106)
GFR calc Af Amer: 68 mL/min/{1.73_m2} (ref >59)
GFR calc non Af Amer: 59 mL/min/{1.73_m2} — ABNORMAL LOW (ref >59)
GLUCOSE: 160 mg/dL — AB (ref 65–99)
Potassium: 3.6 mmol/L (ref 3.5–5.2)
Sodium: 141 mmol/L (ref 134–144)

## 2018-03-31 LAB — PRO B NATRIURETIC PEPTIDE: NT-PRO BNP: 222 pg/mL (ref 0–249)

## 2018-03-31 MED ORDER — ASPIRIN 81 MG PO TBEC: 81.0000 mg | DELAYED_RELEASE_TABLET | Freq: Two times a day (BID) | ORAL | 3 refills | Status: AC

## 2018-03-31 MED ORDER — TORSEMIDE 20 MG PO TABS: 20.0000 mg | ORAL_TABLET | Freq: Two times a day (BID) | ORAL | 3 refills | Status: DC

## 2018-03-31 NOTE — Patient Instructions (Addendum)
Medication Instructions:  Your physician has recommended you make the following change in your medication:  START torsemide 20 mg twice daily  Labwork: Your physician recommends that you have the following labs drawn: BNP and BMP  Testing/Procedures: None  Follow-Up: Your physician recommends that you schedule a follow-up appointment in: 4 weeks  Any Other Special Instructions Will Be Listed Below (If Applicable).     If you need a refill on your cardiac medications before your next appointment, please call your pharmacy.   [facility]  Garey Ham, RN, BSN   DASH diet: Healthy eating to lower your blood pressure The DASH diet emphasizes portion size, eating a variety of foods and getting the right amount of nutrients. Discover how DASH can improve your health and lower your blood pressure. [facility]  DASH stands for Dietary Approaches to Stop Hypertension. The DASH diet is a lifelong approach to healthy eating that's designed to help treat or prevent high blood pressure (hypertension). The DASH diet encourages you to reduce the sodium in your diet and eat a variety of foods rich in nutrients that help lower blood pressure, such as potassium, calcium and magnesium. By following the DASH diet, you may be able to reduce your blood pressure by a few points in just two weeks. Over time, your systolic blood pressure could drop by eight to 14 points, which can make a significant difference in your health risks. Because the DASH diet is a healthy way of eating, it offers health benefits besides just lowering blood pressure. The DASH diet is also in line with dietary recommendations to prevent osteoporosis, cancer, heart disease, stroke and diabetes. DASH diet: Sodium levels The DASH diet emphasizes vegetables, fruits and low-fat dairy foods - and moderate amounts of whole grains, fish, poultry and nuts. In addition to the standard DASH diet, there is also a lower sodium version  of the diet. You can choose the version of the diet that meets your health needs: Standard DASH diet. You can consume up to 2,300 milligrams (mg) of sodium a day.  Lower sodium DASH diet. You can consume up to 1,500 mg of sodium a day. Both versions of the DASH diet aim to reduce the amount of sodium in your diet compared with what you might get in a typical American diet, which can amount to a whopping 3,400 mg of sodium a day or more. The standard DASH diet meets the recommendation from the Dietary Guidelines for Americans to keep daily sodium intake to less than 2,300 mg a day. The American Heart Association recommends 1,500 mg a day of sodium as an upper limit for all adults. If you aren't sure what sodium level is right for you, talk to your doctor. DASH diet: What to eat Both versions of the DASH diet include lots of whole grains, fruits, vegetables and low-fat dairy products. The DASH diet also includes some fish, poultry and legumes, and encourages a small amount of nuts and seeds a few times a week.  You can eat red meat, sweets and fats in small amounts. The DASH diet is low in saturated fat, cholesterol and total fat. Here's a look at the recommended servings from each food group for the 2,000-calorie-a-day DASH diet. Grains: 6 to 8 servings a day Grains include bread, cereal, rice and pasta. Examples of one serving of grains include 1 slice whole-wheat bread, 1 ounce dry cereal, or 1/2 cup cooked cereal, rice or pasta. Focus on whole grains because  they have more fiber and nutrients than do refined grains. For instance, use brown rice instead of white rice, whole-wheat pasta instead of regular pasta and whole-grain bread instead of white bread. Look for products labeled "100 percent whole grain" or "100 percent whole wheat."  Grains are naturally low in fat. Keep them this way by avoiding butter, cream and cheese sauces. Vegetables: 4 to 5 servings a day Tomatoes, carrots, broccoli, sweet  potatoes, greens and other vegetables are full of fiber, vitamins, and such minerals as potassium and magnesium. Examples of one serving include 1 cup raw leafy green vegetables or 1/2 cup cut-up raw or cooked vegetables. Don't think of vegetables only as side dishes - a hearty blend of vegetables served over brown rice or whole-wheat noodles can serve as the main dish for a meal.  Fresh and frozen vegetables are both good choices. When buying frozen and canned vegetables, choose those labeled as low sodium or without added salt.  To increase the number of servings you fit in daily, be creative. In a stir-fry, for instance, cut the amount of meat in half and double up on the vegetables. Fruits: 4 to 5 servings a day Many fruits need little preparation to become a healthy part of a meal or snack. Like vegetables, they're packed with fiber, potassium and magnesium and are typically low in fat - coconuts are an exception. Examples of one serving include one medium fruit, 1/2 cup fresh, frozen or canned fruit, or 4 ounces of juice. Have a piece of fruit with meals and one as a snack, then round out your day with a dessert of fresh fruits topped with a dollop of low-fat yogurt.  Leave on edible peels whenever possible. The peels of apples, pears and most fruits with pits add interesting texture to recipes and contain healthy nutrients and fiber.  Remember that citrus fruits and juices, such as grapefruit, can interact with certain medications, so check with your doctor or pharmacist to see if they're OK for you.  If you choose canned fruit or juice, make sure no sugar is added. Dairy: 2 to 3 servings a day Milk, yogurt, cheese and other dairy products are major sources of calcium, vitamin D and protein. But the key is to make sure that you choose dairy products that are low fat or fat-free because otherwise they can be a major source of fat - and most of it is saturated. Examples of one serving include 1 cup  skim or 1 percent milk, 1 cup low fat yogurt, or 1 1/2 ounces part-skim cheese. Low-fat or fat-free frozen yogurt can help you boost the amount of dairy products you eat while offering a sweet treat. Add fruit for a healthy twist.  If you have trouble digesting dairy products, choose lactose-free products or consider taking an over-the-counter product that contains the enzyme lactase, which can reduce or prevent the symptoms of lactose intolerance.  Go easy on regular and even fat-free cheeses because they are typically high in sodium. Lean meat, poultry and fish: 6 servings or fewer a day Meat can be a rich source of protein, B vitamins, iron and zinc. Choose lean varieties and aim for no more than 6 ounces a day. Cutting back on your meat portion will allow room for more vegetables. Trim away skin and fat from poultry and meat and then bake, broil, grill or roast instead of frying in fat.  Eat heart-healthy fish, such as salmon, herring and tuna. These types of  fish are high in omega-3 fatty acids, which can help lower your total cholesterol. Nuts, seeds and legumes: 4 to 5 servings a week Almonds, sunflower seeds, kidney beans, peas, lentils and other foods in this family are good sources of magnesium, potassium and protein. They're also full of fiber and phytochemicals, which are plant compounds that may protect against some cancers and cardiovascular disease. Serving sizes are small and are intended to be consumed only a few times a week because these foods are high in calories. Examples of one serving include 1/3 cup nuts, 2 tablespoons seeds, or 1/2 cup cooked beans or peas.  Nuts sometimes get a bad rap because of their fat content, but they contain healthy types of fat - monounsaturated fat and omega-3 fatty acids. They're high in calories, however, so eat them in moderation. Try adding them to stir-fries, salads or cereals.  Soybean-based products, such as tofu and tempeh, can be a good  alternative to meat because they contain all of the amino acids your body needs to make a complete protein, just like meat. Fats and oils: 2 to 3 servings a day Fat helps your body absorb essential vitamins and helps your body's immune system. But too much fat increases your risk of heart disease, diabetes and obesity. The DASH diet strives for a healthy balance by limiting total fat to less than 30 percent of daily calories from fat, with a focus on the healthier monounsaturated fats. Examples of one serving include 1 teaspoon soft margarine, 1 tablespoon mayonnaise or 2 tablespoons salad dressing. Saturated fat and trans fat are the main dietary culprits in increasing your risk of coronary artery disease. DASH helps keep your daily saturated fat to less than 6 percent of your total calories by limiting use of meat, butter, cheese, whole milk, cream and eggs in your diet, along with foods made from lard, solid shortenings, and palm and coconut oils.  Avoid trans fat, commonly found in such processed foods as crackers, baked goods and fried items.  Read food labels on margarine and salad dressing so that you can choose those that are lowest in saturated fat and free of trans fat. Sweets: 5 servings or fewer a week You don't have to banish sweets entirely while following the DASH diet - just go easy on them. Examples of one serving include 1 tablespoon sugar, jelly or jam, 1/2 cup sorbet, or 1 cup lemonade. When you eat sweets, choose those that are fat-free or low-fat, such as sorbets, fruit ices, jelly beans, hard candy, graham crackers or low-fat cookies.  Artificial sweeteners such as aspartame (NutraSweet, Equal) and sucralose (Splenda) may help satisfy your sweet tooth while sparing the sugar. But remember that you still must use them sensibly. It's OK to swap a diet cola for a regular cola, but not in place of a more nutritious beverage such as low-fat milk or even plain water.  Cut back on added  sugar, which has no nutritional value but can pack on calories. DASH diet: Alcohol and caffeine Drinking too much alcohol can increase blood pressure. The Dietary Guidelines for Americans recommends that men limit alcohol to no more than two drinks a day and women to one or less. The DASH diet doesn't address caffeine consumption. The influence of caffeine on blood pressure remains unclear. But caffeine can cause your blood pressure to rise at least temporarily. If you already have high blood pressure or if you think caffeine is affecting your blood pressure, talk to your  doctor about your caffeine consumption. DASH diet and weight loss While the DASH diet is not a weight-loss program, you may indeed lose unwanted pounds because it can help guide you toward healthier food choices. The DASH diet generally includes about 2,000 calories a day. If you're trying to lose weight, you may need to eat fewer calories. You may also need to adjust your serving goals based on your individual circumstances - something your health care team can help you decide. Tips to cut back on sodium The foods at the core of the DASH diet are naturally low in sodium. So just by following the DASH diet, you're likely to reduce your sodium intake. You also reduce sodium further by: Using sodium-free spices or flavorings with your food instead of salt  Not adding salt when cooking rice, pasta or hot cereal  Rinsing canned foods to remove some of the sodium  Buying foods labeled "no salt added," "sodium-free," "low sodium" or "very low sodium" One teaspoon of table salt has 2,325 mg of sodium. When you read food labels, you may be surprised at just how much sodium some processed foods contain. Even low-fat soups, canned vegetables, ready-to-eat cereals and sliced [place] from the local deli - foods you may have considered healthy - often have lots of sodium. You may notice a difference in taste when you choose low-sodium food and  beverages. If things seem too bland, gradually introduce low-sodium foods and cut back on table salt until you reach your sodium goal. That'll give your palate time to adjust. Using salt-free seasoning blends or herbs and spices may also ease the transition. It can take several weeks for your taste buds to get used to less salty foods. Putting the pieces of the DASH diet together Try these strategies to get started on the DASH diet:  Change gradually. If you now eat only one or two servings of fruits or vegetables a day, try to add a serving at lunch and one at dinner. Rather than switching to all whole grains, start by making one or two of your grain servings whole grains. Increasing fruits, vegetables and whole grains gradually can also help prevent bloating or diarrhea that may occur if you aren't used to eating a diet with lots of fiber. You can also try over-the-counter products to help reduce gas from beans and vegetables.  Reward successes and forgive slip-ups. Reward yourself with a nonfood treat for your accomplishments - rent a movie, purchase a book or get together with a friend. Everyone slips, especially when learning something new. Remember that changing your lifestyle is a long-term process. Find out what triggered your setback and then just pick up where you left off with the DASH diet.  Add physical activity. To boost your blood pressure lowering efforts even more, consider increasing your physical activity in addition to following the DASH diet. Combining both the DASH diet and physical activity makes it more likely that you'll reduce your blood pressure.  Get support if you need it. If you're having trouble sticking to your diet, talk to your doctor or dietitian about it. You might get some tips that will help you stick to the DASH diet. Remember, healthy eating isn't an all-or-nothing proposition. What's most important is that, on average, you eat healthier foods with plenty of variety -  both to keep your diet nutritious and to avoid boredom or extremes. And with the DASH diet, you can have both. Heart Failure  Weigh yourself every morning when  you first wake up and record on a calender or note pad, bring this to your office visits. Using a pill tender can help with taking your medications consistently.  Limit your fluid intake to 2 liters daily  Limit your sodium intake to less than 2-3 grams daily. Ask if you need dietary teaching.  If you gain more than 3 pounds (from your dry weight ), double your dose of diuretic for the day.  If you gain more than 5 pounds (from your dry weight), double your dose of lasix and call your heart failure doctor.  Please do not smoke tobacco since it is very bad for your heart.  Please do not drink alcohol since it can worsen your heart failure.Also avoid OTC nonsteroidal drugs, such as advil, aleve and motrin.  Try to exercise for at least 30 minutes every day because this will help your heart be more efficient. You may be eligible for supervised cardiac rehab, ask your physician.

## 2018-05-16 ENCOUNTER — Ambulatory Visit (INDEPENDENT_AMBULATORY_CARE_PROVIDER_SITE_OTHER): Payer: BLUE CROSS/BLUE SHIELD | Admitting: Physician Assistant

## 2018-05-16 ENCOUNTER — Encounter: Payer: Self-pay | Admitting: Physician Assistant

## 2018-05-16 ENCOUNTER — Encounter (INDEPENDENT_AMBULATORY_CARE_PROVIDER_SITE_OTHER): Payer: Self-pay

## 2018-05-16 ENCOUNTER — Other Ambulatory Visit: Payer: Self-pay

## 2018-05-16 VITALS — BP 175/89 | HR 82 | Temp 98.0°F | Resp 18 | Ht 66.0 in | Wt 250.5 lb

## 2018-05-16 DIAGNOSIS — I739 Peripheral vascular disease, unspecified: Secondary | ICD-10-CM

## 2018-05-16 NOTE — Progress Notes (Signed)
Patient name: Lori LaineSonya Kandel MRN: [id number] DOB: [birth year] Sex: female    HPI: Lori Carter is a 52 y.o. female who underwent aortobifemoral bypass grafting by Dr. Arbie CookeyEarly on 02/08/17.  Her by pass is patent with bilateral ABI's of 0.7 and TBI's 0.6.  Comes in today secondary to a "bump" and busing on her left leg just below the knee joint line.  She states it was a knot with redness that started last week and just a few days ago the knot went away and now she has a tender bruise.  She doesn't recall an insect bite or traumatic injury to the area.     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  She is having B LE edema by the end of the day and is working with her primary physician.               Past medical history includes: HTN, DM, and hypercholesterolemia.    Past Medical History:  Diagnosis Date  . Acne   . Anemia 02/17/2018  . Anxiety   . Aortic occlusion (HCC) 02/08/2017  . Atopic neurodermatitis   . Cardiomegaly   . Cardiomyopathy (HCC) 04/23/2016  . Cellulitis of toe of left foot    acute  . Complication of anesthesia    woke up during surgery one  20 some years ago  . COPD (chronic obstructive pulmonary disease) (HCC)   . Diabetic neuropathy (HCC)    fingers  . Dyspnea    with exertion  . Dysthymic disorder   . Epidermal inclusion cyst   . Essential hypertension 04/23/2016  . Gastric mass   . Gastric ulcer 02/17/2018  . GERD (gastroesophageal reflux disease)    not taking anything  . Heart murmur    told years ago- , no one has said anything in years  . Hyperlipidemia   . Hypertension, benign    chronic  . Obesity 04/23/2016  . Obstructive sleep apnea    uses cpap set on 13 uses 1/2 of the tiem   . OSA (obstructive sleep apnea) 02/17/2018  . Tobacco abuse 04/23/2016  . Type 2 diabetes mellitus without complication (HCC)    chronic   Past Surgical History:  Procedure Laterality Date  . AORTA - BILATERAL FEMORAL ARTERY BYPASS GRAFT N/A 02/08/2017   Procedure: AORTA BIFEMORAL BYPASS GRAFT;  Surgeon: Larina EarthlyEarly, Todd F, MD;  Location: [facility] OR;  Service: Vascular;  Laterality: N/A;  . EUS N/A 09/09/2017   Procedure: UPPER ENDOSCOPIC ULTRASOUND (EUS) LINEAR;  Surgeon: Rachael FeeJacobs, Daniel P, MD;  Location: [facility];  Service: Endoscopy;  Laterality: N/A;  . none    . NOVASURE ABLATION    . SHOULDER SURGERY Left    rotator cuff  . TUBAL LIGATION      Family History  Problem Relation Age of Onset  . Hypertension Mother   . Clotting disorder Mother   . Heart murmur Father   . Pancreatic cancer Father   . Diabetes Father   . Hypertension Father     SOCIAL HISTORY: Social History   Socioeconomic History  . Marital status: Divorced    Spouse name: Not on file  . Number of children: Not on file  . Years of education: Not on file  . Highest education level: Not on file  Occupational History  . Occupation: CNA  Social Needs  . Financial resource strain: Not on file  . Food insecurity:    Worry: Not on file  Inability: Not on file  . Transportation needs:    Medical: Not on file    Non-medical: Not on file  Tobacco Use  . Smoking status: Light Tobacco Smoker    Packs/day: 0.33    Years: 29.00    Pack years: 9.57  . Smokeless tobacco: Never Used  . Tobacco comment: 3 cigarettes per day  Substance and Sexual Activity  . Alcohol use: No  . Drug use: No  . Sexual activity: Not on file  Lifestyle  . Physical activity:    Days per week: Not on file    Minutes per session: Not on file  . Stress: Not on file  Relationships  . Social connections:    Talks on phone: Not on file    Gets together: Not on file    Attends religious service: Not on file    Active member of club or organization: Not on file    Attends meetings of clubs or organizations: Not on file    Relationship status: Not on file  . Intimate partner violence:    Fear of current or ex partner: Not on file    Emotionally abused: Not on file    Physically abused:  Not on file    Forced sexual activity: Not on file  Other Topics Concern  . Not on file  Social History Narrative  . Not on file    Allergies  Allergen Reactions  . No Known Allergies     Current Outpatient Medications  Medication Sig Dispense Refill  . albuterol (PROVENTIL HFA;VENTOLIN HFA) 108 (90 Base) MCG/ACT inhaler Inhale 2 puffs into the lungs every 6 (six) hours as needed for wheezing or shortness of breath.    Marland Kitchen aspirin EC 81 MG EC tablet Take 1 tablet (81 mg total) by mouth 2 (two) times daily. 30 tablet 3  . atorvastatin (LIPITOR) 10 MG tablet Take 10 mg by mouth daily.    . carvedilol (COREG) 25 MG tablet Take 25 mg by mouth 2 (two) times daily with a meal.    . citalopram (CELEXA) 20 MG tablet Take 20 mg by mouth daily.    Marland Kitchen gabapentin (NEURONTIN) 300 MG capsule Take 300 mg by mouth as needed.     . metFORMIN (GLUCOPHAGE) 500 MG tablet Take 500 mg by mouth 2 (two) times daily with a meal.    . omeprazole (PRILOSEC) 20 MG capsule Take 20 mg by mouth daily.    Marland Kitchen telmisartan (MICARDIS) 40 MG tablet Take 1 tablet (40 mg total) by mouth daily. 30 tablet 3  . torsemide (DEMADEX) 20 MG tablet Take 1 tablet (20 mg total) by mouth 2 (two) times daily. 360 tablet 3  . potassium chloride SA (K-DUR,KLOR-CON) 20 MEQ tablet Take 1 tablet (20 mEq total) by mouth 2 (two) times daily. 60 tablet 0   No current facility-administered medications for this visit.     ROS:   General:  No weight loss, Fever, chills  HEENT: No recent headaches, no nasal bleeding, no visual changes, no sore throat  Neurologic: No dizziness, blackouts, seizures. No recent symptoms of stroke or mini- stroke. No recent episodes of slurred speech, or temporary blindness.  Cardiac: No recent episodes of chest pain/pressure, no shortness of breath at rest.  No shortness of breath with exertion.  Denies history of atrial fibrillation or irregular heartbeat  Vascular: No history of rest pain in feet.  No history  of claudication.  No history of non-healing ulcer, No history of  DVT   Pulmonary: No home oxygen, no productive cough, no hemoptysis,  No asthma or wheezing  Musculoskeletal:  [ ]  Arthritis, [ ]  Low back pain,  [ ]  Joint pain [x]  Hx of left LE weakness compared to the right  Hematologic:No history of hypercoagulable state.  No history of easy bleeding.  No history of anemia  Gastrointestinal: No hematochezia or melena,  No gastroesophageal reflux, no trouble swallowing  Urinary: [ ]  chronic Kidney disease, [ ]  on HD - [ ]  MWF or [ ]  TTHS, [ ]  Burning with urination, [ ]  Frequent urination, [ ]  Difficulty urinating;   Skin: No rashes  Psychological: No history of anxiety,  No history of depression   Physical Examination  Vitals:   05/16/18 1354  BP: (!) 175/89  Pulse: 82  Resp: 18  Temp: 98 F (36.7 C)  TempSrc: Oral  SpO2: 95%  Weight: 250 lb 8 oz (113.6 kg)  Height: 5\' 6"  (1.676 m)    Body mass index is 40.43 kg/m.  General:  Alert and oriented, no acute distress HEENT: Normal, normocephalic Neck: No bruit or JVD Pulmonary: Clear to auscultation bilaterally Cardiac: Regular Rate and Rhythm without murmur Abdomen: Soft, non-tender, non-distended, no mass, no scars Skin: No rash Extremity Pulses:  2+ radial, brachial, femoral, doppler signals dorsalis pedis,peroneal, posterior tibial pulses bilaterally Musculoskeletal:soft tender 3 cm bruise medial and distal to the left knee joint. Bilateral LE non pitting edema from the knees down symmetrical  Neurologic: Upper and lower extremity motor 5/5 and symmetric    ASSESSMENT:   Left LE unexplained bruise that is resolving   PLAN:   S/P aortobifemoral bypass grafting by Dr. Arbie Cookey on 02/08/17.  Her LE edema is symmetrical.  The ecchymosis appears to be dissipating and she is not affected on her daily activity.  She continues to work full time.  I don't feel that this bruising is dangerous and it will continue to  dissipates with time.  If it does not or gets bigger she will call the office.  She plans on getting compression garments for the edema.  She will f/u in 8 months at her regularly scheduled appointment.     Mosetta Pigeon PA-C [facility] Office: [phone]

## 2018-06-17 DIAGNOSIS — Z23 Encounter for immunization: Secondary | ICD-10-CM | POA: Diagnosis not present

## 2018-06-17 DIAGNOSIS — E1159 Type 2 diabetes mellitus with other circulatory complications: Secondary | ICD-10-CM | POA: Diagnosis not present

## 2018-06-17 DIAGNOSIS — I10 Essential (primary) hypertension: Secondary | ICD-10-CM | POA: Diagnosis not present

## 2018-06-17 DIAGNOSIS — E782 Mixed hyperlipidemia: Secondary | ICD-10-CM | POA: Diagnosis not present

## 2018-06-17 DIAGNOSIS — Z201 Contact with and (suspected) exposure to tuberculosis: Secondary | ICD-10-CM | POA: Diagnosis not present

## 2018-06-17 DIAGNOSIS — E1142 Type 2 diabetes mellitus with diabetic polyneuropathy: Secondary | ICD-10-CM | POA: Diagnosis not present

## 2018-06-20 DIAGNOSIS — Z111 Encounter for screening for respiratory tuberculosis: Secondary | ICD-10-CM | POA: Diagnosis not present

## 2018-07-01 DIAGNOSIS — E876 Hypokalemia: Secondary | ICD-10-CM | POA: Diagnosis not present

## 2018-07-13 NOTE — Progress Notes (Signed)
Cardiology Office Note:    Date:  07/14/2018   ID:  Lori Carter, DOB [birth year], MRN [id number]  PCP:  Abigail MiyamotoPerry, Lawrence Edward, MD  Cardiologist:  Norman HerrlichBrian Munley, MD    Referring MD: Abigail MiyamotoPerry, Lawrence Edward,*    ASSESSMENT:    1. Hypertensive heart disease with heart failure (HCC)   2. Hypertensive heart disease with chronic diastolic congestive heart failure (HCC)   3. Hypokalemia    PLAN:    In order of problems listed above:  1. Heart failure is mildly decompensated will increase her loop diuretic educated stressed the importance of sodium restriction to recheck labs with her hypokalemia including BMP and proBNP continue current antihypertensive her blood pressure target. 2. See above 3. Recheck BMP today continue potassium supplement   Next appointment: 6 months   Medication Adjustments/Labs and Tests Ordered: Current medicines are reviewed at length with the patient today.  Concerns regarding medicines are outlined above.  Orders Placed This Encounter  Procedures  . Basic Metabolic Panel (BMET)  . Pro b natriuretic peptide (BNP)   Meds ordered this encounter  Medications  . torsemide (DEMADEX) 20 MG tablet    Sig: Take 1 tablet (20 mg) twice daily. Take an extra tablet (20 mg) in the morning on M,W,F.    Dispense:  270 tablet    Refill:  1    Patient will take 20mg  BID for 2 days, then 20mg  daily as instructed  . telmisartan (MICARDIS) 40 MG tablet    Sig: Take 1 tablet (40 mg total) by mouth daily.    Dispense:  90 tablet    Refill:  1    Chief Complaint  Patient presents with  . Follow-up  . Congestive Heart Failure    History of Present Illness:    Lori LoronSonya Lanette Carter is a 52 y.o. female with a hx of heart failure,mild to moderate AR and mild MR,  cardiomyopathy, hypertension hyperlipidemia type 2 diabetes peripheral arterial disease with aortobifemoral bypass surgery last seen 03/31/18. Compliance with diet, lifestyle and medications:  Yes  She is trying very hard to sodium restrict but is still using canned products notes her weight varies 3 to 5 pounds a day is likely up 5 to 10 pounds from baseline has intermittent.  She had trouble with potassium supplements Dr. Marina GoodellPerry switched and she can tolerate these.  She has edema but no shortness of breath orthopnea no palpitation or syncope. Past Medical History:  Diagnosis Date  . Acne   . Anemia 02/17/2018  . Anxiety   . Aortic occlusion (HCC) 02/08/2017  . Atopic neurodermatitis   . Cardiomegaly   . Cardiomyopathy (HCC) 04/23/2016  . Cellulitis of toe of left foot    acute  . Complication of anesthesia    woke up during surgery one  20 some years ago  . COPD (chronic obstructive pulmonary disease) (HCC)   . Diabetic neuropathy (HCC)    fingers  . Dyspnea    with exertion  . Dysthymic disorder   . Epidermal inclusion cyst   . Essential hypertension 04/23/2016  . Gastric mass   . Gastric ulcer 02/17/2018  . GERD (gastroesophageal reflux disease)    not taking anything  . Heart murmur    told years ago- , no one has said anything in years  . Hyperlipidemia   . Hypertension, benign    chronic  . Obesity 04/23/2016  . Obstructive sleep apnea    uses cpap set on 13 uses 1/2 of  the tiem   . OSA (obstructive sleep apnea) 02/17/2018  . Tobacco abuse 04/23/2016  . Type 2 diabetes mellitus without complication (HCC)    chronic    Past Surgical History:  Procedure Laterality Date  . AORTA - BILATERAL FEMORAL ARTERY BYPASS GRAFT N/A 02/08/2017   Procedure: AORTA BIFEMORAL BYPASS GRAFT;  Surgeon: Larina Earthly, MD;  Location: [facility] OR;  Service: Vascular;  Laterality: N/A;  . EUS N/A 09/09/2017   Procedure: UPPER ENDOSCOPIC ULTRASOUND (EUS) LINEAR;  Surgeon: Rachael Fee, MD;  Location: [facility];  Service: Endoscopy;  Laterality: N/A;  . none    . NOVASURE ABLATION    . SHOULDER SURGERY Left    rotator cuff  . TUBAL LIGATION      Current Medications: Current Meds   Medication Sig  . albuterol (PROVENTIL HFA;VENTOLIN HFA) 108 (90 Base) MCG/ACT inhaler Inhale 2 puffs into the lungs every 6 (six) hours as needed for wheezing or shortness of breath.  Marland Kitchen aspirin EC 81 MG EC tablet Take 1 tablet (81 mg total) by mouth 2 (two) times daily.  Marland Kitchen atorvastatin (LIPITOR) 10 MG tablet Take 10 mg by mouth daily.  . carvedilol (COREG) 25 MG tablet Take 25 mg by mouth 2 (two) times daily with a meal.  . citalopram (CELEXA) 20 MG tablet Take 20 mg by mouth daily.  . metFORMIN (GLUCOPHAGE) 500 MG tablet Take 500 mg by mouth 2 (two) times daily with a meal.  . omeprazole (PRILOSEC) 20 MG capsule Take 20 mg by mouth daily.  . potassium chloride (MICRO-K) 10 MEQ CR capsule Take 1 capsule by mouth daily.  Marland Kitchen telmisartan (MICARDIS) 40 MG tablet Take 1 tablet (40 mg total) by mouth daily.  Marland Kitchen torsemide (DEMADEX) 20 MG tablet Take 1 tablet (20 mg) twice daily. Take an extra tablet (20 mg) in the morning on M,W,F.  . [DISCONTINUED] telmisartan (MICARDIS) 40 MG tablet Take 1 tablet (40 mg total) by mouth daily.  . [DISCONTINUED] torsemide (DEMADEX) 20 MG tablet Take 1 tablet (20 mg total) by mouth 2 (two) times daily.     Allergies:   Patient has no known allergies.   Social History   Socioeconomic History  . Marital status: Divorced    Spouse name: Not on file  . Number of children: Not on file  . Years of education: Not on file  . Highest education level: Not on file  Occupational History  . Occupation: CNA  Social Needs  . Financial resource strain: Not on file  . Food insecurity:    Worry: Not on file    Inability: Not on file  . Transportation needs:    Medical: Not on file    Non-medical: Not on file  Tobacco Use  . Smoking status: Light Tobacco Smoker    Packs/day: 0.33    Years: 29.00    Pack years: 9.57  . Smokeless tobacco: Never Used  . Tobacco comment: 3 cigarettes per day  Substance and Sexual Activity  . Alcohol use: No  . Drug use: No  . Sexual  activity: Not on file  Lifestyle  . Physical activity:    Days per week: Not on file    Minutes per session: Not on file  . Stress: Not on file  Relationships  . Social connections:    Talks on phone: Not on file    Gets together: Not on file    Attends religious service: Not on file    Active member of  club or organization: Not on file    Attends meetings of clubs or organizations: Not on file    Relationship status: Not on file  Other Topics Concern  . Not on file  Social History Narrative  . Not on file     Family History: The patient's family history includes Clotting disorder in her mother; Diabetes in her father; Heart murmur in her father; Hypertension in her father and mother; Pancreatic cancer in her father. ROS:   Please see the history of present illness.    All other systems reviewed and are negative.  EKGs/Labs/Other Studies Reviewed:    The following studies were reviewed today:   Recent Labs: 03/16/2018: Hemoglobin 11.9; Platelets 203 03/31/2018: BUN 12; Creatinine, Ser 1.08; NT-Pro BNP 222; Potassium 3.6; Sodium 141  Recent Lipid Panel No results found for: CHOL, TRIG, HDL, CHOLHDL, VLDL, LDLCALC, LDLDIRECT  Physical Exam:    VS:  BP 122/76 (BP Location: Left Arm, Patient Position: Sitting, Cuff Size: Large)   Pulse 73   Ht 5\' 6"  (1.676 m)   Wt 253 lb (114.8 kg)   SpO2 96%   BMI 40.84 kg/m     Wt Readings from Last 3 Encounters:  07/14/18 253 lb (114.8 kg)  05/16/18 250 lb 8 oz (113.6 kg)  03/31/18 248 lb (112.5 kg)     GEN:  Well nourished, well developed in no acute distress HEENT: Normal NECK: No JVD; No carotid bruits LYMPHATICS: No lymphadenopathy CARDIAC: RRR, no murmurs, rubs, gallops RESPIRATORY:  Clear to auscultation without rales, wheezing or rhonchi  ABDOMEN: Soft, non-tender, non-distended MUSCULOSKELETAL: 1+ bilateral to the knee edema; No deformity  SKIN: Warm and dry NEUROLOGIC:  Alert and oriented x 3 PSYCHIATRIC:  Normal  affect    Signed, Norman Herrlich, MD  07/14/2018 8:47 AM    [facility]

## 2018-07-14 ENCOUNTER — Ambulatory Visit (INDEPENDENT_AMBULATORY_CARE_PROVIDER_SITE_OTHER): Payer: BLUE CROSS/BLUE SHIELD | Admitting: Cardiology

## 2018-07-14 ENCOUNTER — Encounter: Payer: Self-pay | Admitting: Cardiology

## 2018-07-14 VITALS — BP 122/76 | HR 73 | Ht 66.0 in | Wt 253.0 lb

## 2018-07-14 DIAGNOSIS — I110 Hypertensive heart disease with heart failure: Secondary | ICD-10-CM

## 2018-07-14 DIAGNOSIS — I5032 Chronic diastolic (congestive) heart failure: Secondary | ICD-10-CM

## 2018-07-14 DIAGNOSIS — E876 Hypokalemia: Secondary | ICD-10-CM | POA: Diagnosis not present

## 2018-07-14 LAB — BASIC METABOLIC PANEL
BUN/Creatinine Ratio: 13 (ref 9–23)
BUN: 12 mg/dL (ref 6–24)
CALCIUM: 9.3 mg/dL (ref 8.7–10.2)
CO2: 25 mmol/L (ref 20–29)
CREATININE: 0.89 mg/dL (ref 0.57–1.00)
Chloride: 102 mmol/L (ref 96–106)
GFR, EST AFRICAN AMERICAN: 85 mL/min/{1.73_m2} (ref >59)
GFR, EST NON AFRICAN AMERICAN: 74 mL/min/{1.73_m2} (ref >59)
Glucose: 126 mg/dL — ABNORMAL HIGH (ref 65–99)
Potassium: 4 mmol/L (ref 3.5–5.2)
Sodium: 140 mmol/L (ref 134–144)

## 2018-07-14 LAB — PRO B NATRIURETIC PEPTIDE: NT-Pro BNP: 566 pg/mL — ABNORMAL HIGH (ref 0–249)

## 2018-07-14 MED ORDER — TORSEMIDE 20 MG PO TABS: ORAL_TABLET | ORAL | 1 refills | Status: DC

## 2018-07-14 MED ORDER — TELMISARTAN 40 MG PO TABS: 40.0000 mg | ORAL_TABLET | Freq: Every day | ORAL | 1 refills | Status: DC

## 2018-07-14 NOTE — Patient Instructions (Addendum)
Medication Instructions:  Your physician has recommended you make the following change in your medication:  INCREASE torsemide (demadex) 20 mg: Take 1 tablet twice daily. Take 1 extra tablet (20 mg) in the morning on Monday, Wednesday, and Friday  If you need a refill on your cardiac medications before your next appointment, please call your pharmacy.   Lab work: Your physician recommends that you return for lab work today: BMP, ProBNP.  If you have labs (blood work) drawn today and your tests are completely normal, you will receive your results only by: Marland Kitchen. MyChart Message (if you have MyChart) OR . A paper copy in the mail If you have any lab test that is abnormal or we need to change your treatment, we will call you to review the results.  Testing/Procedures: None  Follow-Up: At [facility], you and your health needs are our priority.  As part of our continuing mission to provide you with exceptional heart care, we have created designated Provider Care Teams.  These Care Teams include your primary Cardiologist (physician) and Advanced Practice Providers (APPs -  Physician Assistants and Nurse Practitioners) who all work together to provide you with the care you need, when you need it. You will need a follow up appointment in 6 months.  Please call our office 2 months in advance to schedule this appointment.         DASH diet: Healthy eating to lower your blood pressure The DASH diet emphasizes portion size, eating a variety of foods and getting the right amount of nutrients. Discover how DASH can improve your health and lower your blood pressure. By [facility] Staff  DASH stands for Dietary Approaches to Stop Hypertension. The DASH diet is a lifelong approach to healthy eating that's designed to help treat or prevent high blood pressure (hypertension). The DASH diet encourages you to reduce the sodium in your diet and eat a variety of foods rich in nutrients that help lower blood  pressure, such as potassium, calcium and magnesium. By following the DASH diet, you may be able to reduce your blood pressure by a few points in just two weeks. Over time, your systolic blood pressure could drop by eight to 14 points, which can make a significant difference in your health risks. Because the DASH diet is a healthy way of eating, it offers health benefits besides just lowering blood pressure. The DASH diet is also in line with dietary recommendations to prevent osteoporosis, cancer, heart disease, stroke and diabetes. DASH diet: Sodium levels The DASH diet emphasizes vegetables, fruits and low-fat dairy foods - and moderate amounts of whole grains, fish, poultry and nuts. In addition to the standard DASH diet, there is also a lower sodium version of the diet. You can choose the version of the diet that meets your health needs: Standard DASH diet. You can consume up to 2,300 milligrams (mg) of sodium a day.  Lower sodium DASH diet. You can consume up to 1,500 mg of sodium a day. Both versions of the DASH diet aim to reduce the amount of sodium in your diet compared with what you might get in a typical American diet, which can amount to a whopping 3,400 mg of sodium a day or more. The standard DASH diet meets the recommendation from the Dietary Guidelines for Americans to keep daily sodium intake to less than 2,300 mg a day. The American Heart Association recommends 1,500 mg a day of sodium as an upper limit for all adults. If  you aren't sure what sodium level is right for you, talk to your doctor. DASH diet: What to eat Both versions of the DASH diet include lots of whole grains, fruits, vegetables and low-fat dairy products. The DASH diet also includes some fish, poultry and legumes, and encourages a small amount of nuts and seeds a few times a week.  You can eat red meat, sweets and fats in small amounts. The DASH diet is low in saturated fat, cholesterol and total fat. Here's a look at  the recommended servings from each food group for the 2,000-calorie-a-day DASH diet. Grains: 6 to 8 servings a day Grains include bread, cereal, rice and pasta. Examples of one serving of grains include 1 slice whole-wheat bread, 1 ounce dry cereal, or 1/2 cup cooked cereal, rice or pasta. Focus on whole grains because they have more fiber and nutrients than do refined grains. For instance, use brown rice instead of white rice, whole-wheat pasta instead of regular pasta and whole-grain bread instead of white bread. Look for products labeled "100 percent whole grain" or "100 percent whole wheat."  Grains are naturally low in fat. Keep them this way by avoiding butter, cream and cheese sauces. Vegetables: 4 to 5 servings a day Tomatoes, carrots, broccoli, sweet potatoes, greens and other vegetables are full of fiber, vitamins, and such minerals as potassium and magnesium. Examples of one serving include 1 cup raw leafy green vegetables or 1/2 cup cut-up raw or cooked vegetables. Don't think of vegetables only as side dishes - a hearty blend of vegetables served over brown rice or whole-wheat noodles can serve as the main dish for a meal.  Fresh and frozen vegetables are both good choices. When buying frozen and canned vegetables, choose those labeled as low sodium or without added salt.  To increase the number of servings you fit in daily, be creative. In a stir-fry, for instance, cut the amount of meat in half and double up on the vegetables. Fruits: 4 to 5 servings a day Many fruits need little preparation to become a healthy part of a meal or snack. Like vegetables, they're packed with fiber, potassium and magnesium and are typically low in fat - coconuts are an exception. Examples of one serving include one medium fruit, 1/2 cup fresh, frozen or canned fruit, or 4 ounces of juice. Have a piece of fruit with meals and one as a snack, then round out your day with a dessert of fresh fruits topped with a  dollop of low-fat yogurt.  Leave on edible peels whenever possible. The peels of apples, pears and most fruits with pits add interesting texture to recipes and contain healthy nutrients and fiber.  Remember that citrus fruits and juices, such as grapefruit, can interact with certain medications, so check with your doctor or pharmacist to see if they're OK for you.  If you choose canned fruit or juice, make sure no sugar is added. Dairy: 2 to 3 servings a day Milk, yogurt, cheese and other dairy products are major sources of calcium, vitamin D and protein. But the key is to make sure that you choose dairy products that are low fat or fat-free because otherwise they can be a major source of fat - and most of it is saturated. Examples of one serving include 1 cup skim or 1 percent milk, 1 cup low fat yogurt, or 1 1/2 ounces part-skim cheese. Low-fat or fat-free frozen yogurt can help you boost the amount of dairy products you eat  while offering a sweet treat. Add fruit for a healthy twist.  If you have trouble digesting dairy products, choose lactose-free products or consider taking an over-the-counter product that contains the enzyme lactase, which can reduce or prevent the symptoms of lactose intolerance.  Go easy on regular and even fat-free cheeses because they are typically high in sodium. Lean meat, poultry and fish: 6 servings or fewer a day Meat can be a rich source of protein, B vitamins, iron and zinc. Choose lean varieties and aim for no more than 6 ounces a day. Cutting back on your meat portion will allow room for more vegetables. Trim away skin and fat from poultry and meat and then bake, broil, grill or roast instead of frying in fat.  Eat heart-healthy fish, such as salmon, herring and tuna. These types of fish are high in omega-3 fatty acids, which can help lower your total cholesterol. Nuts, seeds and legumes: 4 to 5 servings a week Almonds, sunflower seeds, kidney beans, peas, lentils  and other foods in this family are good sources of magnesium, potassium and protein. They're also full of fiber and phytochemicals, which are plant compounds that may protect against some cancers and cardiovascular disease. Serving sizes are small and are intended to be consumed only a few times a week because these foods are high in calories. Examples of one serving include 1/3 cup nuts, 2 tablespoons seeds, or 1/2 cup cooked beans or peas.  Nuts sometimes get a bad rap because of their fat content, but they contain healthy types of fat - monounsaturated fat and omega-3 fatty acids. They're high in calories, however, so eat them in moderation. Try adding them to stir-fries, salads or cereals.  Soybean-based products, such as tofu and tempeh, can be a good alternative to meat because they contain all of the amino acids your body needs to make a complete protein, just like meat. Fats and oils: 2 to 3 servings a day Fat helps your body absorb essential vitamins and helps your body's immune system. But too much fat increases your risk of heart disease, diabetes and obesity. The DASH diet strives for a healthy balance by limiting total fat to less than 30 percent of daily calories from fat, with a focus on the healthier monounsaturated fats. Examples of one serving include 1 teaspoon soft margarine, 1 tablespoon mayonnaise or 2 tablespoons salad dressing. Saturated fat and trans fat are the main dietary culprits in increasing your risk of coronary artery disease. DASH helps keep your daily saturated fat to less than 6 percent of your total calories by limiting use of meat, butter, cheese, whole milk, cream and eggs in your diet, along with foods made from lard, solid shortenings, and palm and coconut oils.  Avoid trans fat, commonly found in such processed foods as crackers, baked goods and fried items.  Read food labels on margarine and salad dressing so that you can choose those that are lowest in saturated  fat and free of trans fat. Sweets: 5 servings or fewer a week You don't have to banish sweets entirely while following the DASH diet - just go easy on them. Examples of one serving include 1 tablespoon sugar, jelly or jam, 1/2 cup sorbet, or 1 cup lemonade. When you eat sweets, choose those that are fat-free or low-fat, such as sorbets, fruit ices, jelly beans, hard candy, graham crackers or low-fat cookies.  Artificial sweeteners such as aspartame (NutraSweet, Equal) and sucralose (Splenda) may help satisfy your sweet tooth  while sparing the sugar. But remember that you still must use them sensibly. It's OK to swap a diet cola for a regular cola, but not in place of a more nutritious beverage such as low-fat milk or even plain water.  Cut back on added sugar, which has no nutritional value but can pack on calories. DASH diet: Alcohol and caffeine Drinking too much alcohol can increase blood pressure. The Dietary Guidelines for Americans recommends that men limit alcohol to no more than two drinks a day and women to one or less. The DASH diet doesn't address caffeine consumption. The influence of caffeine on blood pressure remains unclear. But caffeine can cause your blood pressure to rise at least temporarily. If you already have high blood pressure or if you think caffeine is affecting your blood pressure, talk to your doctor about your caffeine consumption. DASH diet and weight loss While the DASH diet is not a weight-loss program, you may indeed lose unwanted pounds because it can help guide you toward healthier food choices. The DASH diet generally includes about 2,000 calories a day. If you're trying to lose weight, you may need to eat fewer calories. You may also need to adjust your serving goals based on your individual circumstances - something your health care team can help you decide. Tips to cut back on sodium The foods at the core of the DASH diet are naturally low in sodium. So just by  following the DASH diet, you're likely to reduce your sodium intake. You also reduce sodium further by: Using sodium-free spices or flavorings with your food instead of salt  Not adding salt when cooking rice, pasta or hot cereal  Rinsing canned foods to remove some of the sodium  Buying foods labeled "no salt added," "sodium-free," "low sodium" or "very low sodium" One teaspoon of table salt has 2,325 mg of sodium. When you read food labels, you may be surprised at just how much sodium some processed foods contain. Even low-fat soups, canned vegetables, ready-to-eat cereals and sliced [place] from the local deli - foods you may have considered healthy - often have lots of sodium. You may notice a difference in taste when you choose low-sodium food and beverages. If things seem too bland, gradually introduce low-sodium foods and cut back on table salt until you reach your sodium goal. That'll give your palate time to adjust. Using salt-free seasoning blends or herbs and spices may also ease the transition. It can take several weeks for your taste buds to get used to less salty foods. Putting the pieces of the DASH diet together Try these strategies to get started on the DASH diet:  Change gradually. If you now eat only one or two servings of fruits or vegetables a day, try to add a serving at lunch and one at dinner. Rather than switching to all whole grains, start by making one or two of your grain servings whole grains. Increasing fruits, vegetables and whole grains gradually can also help prevent bloating or diarrhea that may occur if you aren't used to eating a diet with lots of fiber. You can also try over-the-counter products to help reduce gas from beans and vegetables.  Reward successes and forgive slip-ups. Reward yourself with a nonfood treat for your accomplishments - rent a movie, purchase a book or get together with a friend. Everyone slips, especially when learning something new. Remember  that changing your lifestyle is a long-term process. Find out what triggered your setback and then just  pick up where you left off with the DASH diet.  Add physical activity. To boost your blood pressure lowering efforts even more, consider increasing your physical activity in addition to following the DASH diet. Combining both the DASH diet and physical activity makes it more likely that you'll reduce your blood pressure.  Get support if you need it. If you're having trouble sticking to your diet, talk to your doctor or dietitian about it. You might get some tips that will help you stick to the DASH diet. Remember, healthy eating isn't an all-or-nothing proposition. What's most important is that, on average, you eat healthier foods with plenty of variety - both to keep your diet nutritious and to avoid boredom or extremes. And with the DASH diet, you can have both.  Heart Failure  Weigh yourself every morning when you first wake up and record on a calender or note pad, bring this to your office visits. Using a pill tender can help with taking your medications consistently.  Limit your fluid intake to 2 liters daily  Limit your sodium intake to less than 2-3 grams daily. Ask if you need dietary teaching.  If you gain more than 3 pounds (from your dry weight ), double your dose of diuretic for the day.  If you gain more than 5 pounds (from your dry weight), double your dose of lasix and call your heart failure doctor.  Please do not smoke tobacco since it is very bad for your heart.  Please do not drink alcohol since it can worsen your heart failure.Also avoid OTC nonsteroidal drugs, such as advil, aleve and motrin.  Try to exercise for at least 30 minutes every day because this will help your heart be more efficient. You may be eligible for supervised cardiac rehab, ask your physician.

## 2018-07-15 ENCOUNTER — Telehealth: Payer: Self-pay

## 2018-07-15 NOTE — Telephone Encounter (Signed)
Left message for patient to return call.

## 2018-07-19 NOTE — Telephone Encounter (Signed)
Patient notified of stable lab results per Dr Dulce SellarMunley.  Patient advised to continue current medications and keep follow up appointments as planned.  Patient verbalized understanding.

## 2018-07-28 IMAGING — NM NM MISC PROCEDURE
9 series · 54 of 54 positions shown · non-contrast
Comparison: none

[Series 1: wbr_s-proj_st wbr stress-gsp · 6.40mm/px · 6 of 512 frames shown]
[frame 43/512]
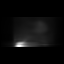
[frame 128/512]
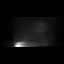
[frame 214/512]
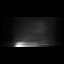
[frame 299/512]
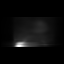
[frame 384/512]
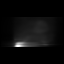
[frame 470/512]
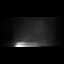

[Series 1: stress sax · 6.4mm · 6.40mm/px · 6 of 25 frames shown]
[frame 3/25]
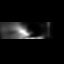
[frame 7/25]
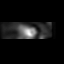
[frame 11/25]
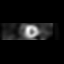
[frame 15/25]
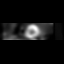
[frame 19/25]
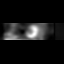
[frame 23/25]
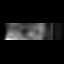

[Series 1: stress sax gs · 6.4mm · 6.40mm/px · 6 of 200 frames shown]
[frame 17/200]
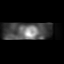
[frame 50/200]
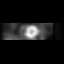
[frame 84/200]
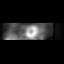
[frame 117/200]
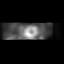
[frame 150/200]
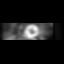
[frame 184/200]
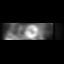

[Series 1: wbr stress-gsp · 6.40mm/px · 6 of 512 frames shown]
[frame 43/512]
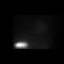
[frame 128/512]
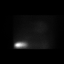
[frame 214/512]
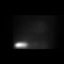
[frame 299/512]
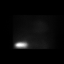
[frame 384/512]
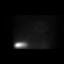
[frame 470/512]
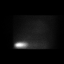

[Series 2: wbr_s-proj_st wbr stress-sum-em · 6.40mm/px · 6 of 64 frames shown]
[frame 6/64]
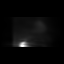
[frame 16/64]
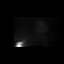
[frame 27/64]
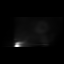
[frame 38/64]
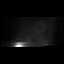
[frame 48/64]
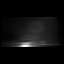
[frame 59/64]
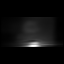

[Series 2: wbr stress-sum-em · 6.40mm/px · 6 of 64 frames shown]
[frame 6/64]
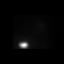
[frame 16/64]
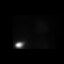
[frame 27/64]
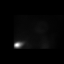
[frame 38/64]
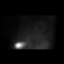
[frame 48/64]
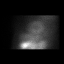
[frame 59/64]
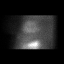

[Series 3: rest sax · 6.4mm · 6.40mm/px · 6 of 25 frames shown]
[frame 3/25]
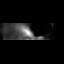
[frame 7/25]
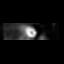
[frame 11/25]
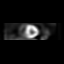
[frame 15/25]
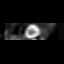
[frame 19/25]
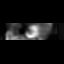
[frame 23/25]
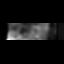

[Series 3: wbr_r-proj_st wbr rest · 6.40mm/px · 6 of 64 frames shown]
[frame 6/64]
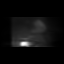
[frame 16/64]
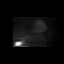
[frame 27/64]
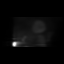
[frame 38/64]
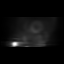
[frame 48/64]
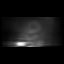
[frame 59/64]
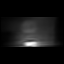

[Series 3: wbr rest · 6.40mm/px · 6 of 64 frames shown]
[frame 6/64]
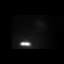
[frame 16/64]
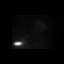
[frame 27/64]
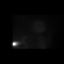
[frame 38/64]
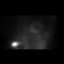
[frame 48/64]
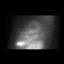
[frame 59/64]
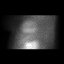

[54 of 54 positions shown; findings below may reference images not displayed]

Canned report from images found in remote index.

Refer to host system for actual result text.

## 2018-08-08 DIAGNOSIS — L738 Other specified follicular disorders: Secondary | ICD-10-CM | POA: Diagnosis not present

## 2018-08-08 DIAGNOSIS — R87610 Atypical squamous cells of undetermined significance on cytologic smear of cervix (ASC-US): Secondary | ICD-10-CM | POA: Diagnosis not present

## 2018-08-08 DIAGNOSIS — Z6841 Body Mass Index (BMI) 40.0 and over, adult: Secondary | ICD-10-CM | POA: Diagnosis not present

## 2018-08-08 DIAGNOSIS — Z1231 Encounter for screening mammogram for malignant neoplasm of breast: Secondary | ICD-10-CM | POA: Diagnosis not present

## 2018-08-08 DIAGNOSIS — Z01419 Encounter for gynecological examination (general) (routine) without abnormal findings: Secondary | ICD-10-CM | POA: Diagnosis not present

## 2018-09-22 DIAGNOSIS — R2231 Localized swelling, mass and lump, right upper limb: Secondary | ICD-10-CM | POA: Insufficient documentation

## 2019-01-30 ENCOUNTER — Encounter (HOSPITAL_COMMUNITY): Payer: Self-pay

## 2019-01-30 ENCOUNTER — Other Ambulatory Visit: Payer: Self-pay

## 2019-01-30 ENCOUNTER — Emergency Department (HOSPITAL_COMMUNITY)
Admission: EM | Admit: 2019-01-30 | Discharge: 2019-01-31 | Disposition: A | Discharge status: Other Acute Inpatient Hospital | Payer: BC Managed Care – PPO | Source: Home / Self Care | Attending: Emergency Medicine | Admitting: Emergency Medicine

## 2019-01-30 DIAGNOSIS — I10 Essential (primary) hypertension: Secondary | ICD-10-CM | POA: Insufficient documentation

## 2019-01-30 DIAGNOSIS — E1140 Type 2 diabetes mellitus with diabetic neuropathy, unspecified: Secondary | ICD-10-CM | POA: Diagnosis not present

## 2019-01-30 DIAGNOSIS — F329 Major depressive disorder, single episode, unspecified: Secondary | ICD-10-CM | POA: Insufficient documentation

## 2019-01-30 DIAGNOSIS — Z79899 Other long term (current) drug therapy: Secondary | ICD-10-CM | POA: Insufficient documentation

## 2019-01-30 DIAGNOSIS — R45851 Suicidal ideations: Secondary | ICD-10-CM

## 2019-01-30 DIAGNOSIS — Z7982 Long term (current) use of aspirin: Secondary | ICD-10-CM | POA: Insufficient documentation

## 2019-01-30 DIAGNOSIS — F322 Major depressive disorder, single episode, severe without psychotic features: Secondary | ICD-10-CM | POA: Diagnosis not present

## 2019-01-30 DIAGNOSIS — K219 Gastro-esophageal reflux disease without esophagitis: Secondary | ICD-10-CM | POA: Diagnosis not present

## 2019-01-30 DIAGNOSIS — E119 Type 2 diabetes mellitus without complications: Secondary | ICD-10-CM | POA: Insufficient documentation

## 2019-01-30 DIAGNOSIS — Z1159 Encounter for screening for other viral diseases: Secondary | ICD-10-CM | POA: Insufficient documentation

## 2019-01-30 DIAGNOSIS — F17210 Nicotine dependence, cigarettes, uncomplicated: Secondary | ICD-10-CM | POA: Insufficient documentation

## 2019-01-30 DIAGNOSIS — Z7984 Long term (current) use of oral hypoglycemic drugs: Secondary | ICD-10-CM | POA: Insufficient documentation

## 2019-01-30 DIAGNOSIS — I110 Hypertensive heart disease with heart failure: Secondary | ICD-10-CM | POA: Diagnosis not present

## 2019-01-30 DIAGNOSIS — G4700 Insomnia, unspecified: Secondary | ICD-10-CM | POA: Diagnosis not present

## 2019-01-30 DIAGNOSIS — Z03818 Encounter for observation for suspected exposure to other biological agents ruled out: Secondary | ICD-10-CM | POA: Diagnosis not present

## 2019-01-30 DIAGNOSIS — J449 Chronic obstructive pulmonary disease, unspecified: Secondary | ICD-10-CM | POA: Insufficient documentation

## 2019-01-30 LAB — COMPREHENSIVE METABOLIC PANEL
ALT: 25 U/L (ref 0–44)
AST: 29 U/L (ref 15–41)
Albumin: 3.5 g/dL (ref 3.5–5.0)
Alkaline Phosphatase: 114 U/L (ref 38–126)
Anion gap: 10 (ref 5–15)
BUN: 12 mg/dL (ref 6–20)
CO2: 25 mmol/L (ref 22–32)
Calcium: 9.1 mg/dL (ref 8.9–10.3)
Chloride: 103 mmol/L (ref 98–111)
Creatinine, Ser: 0.9 mg/dL (ref 0.44–1.00)
GFR calc Af Amer: >60 mL/min (ref >60)
GFR calc non Af Amer: >60 mL/min (ref >60)
Glucose, Bld: 161 mg/dL — ABNORMAL HIGH (ref 70–99)
Potassium: 3.3 mmol/L — ABNORMAL LOW (ref 3.5–5.1)
Sodium: 138 mmol/L (ref 135–145)
Total Bilirubin: 0.7 mg/dL (ref 0.3–1.2)
Total Protein: 6.9 g/dL (ref 6.5–8.1)

## 2019-01-30 LAB — RAPID URINE DRUG SCREEN, HOSP PERFORMED
Amphetamines: NOT DETECTED
Barbiturates: NOT DETECTED
Benzodiazepines: NOT DETECTED
Cocaine: NOT DETECTED
Opiates: NOT DETECTED
Tetrahydrocannabinol: NOT DETECTED

## 2019-01-30 LAB — CBC
HCT: 39.3 % (ref 36.0–46.0)
Hemoglobin: 12.8 g/dL (ref 12.0–15.0)
MCH: 28.7 pg (ref 26.0–34.0)
MCHC: 32.6 g/dL (ref 30.0–36.0)
MCV: 88.1 fL (ref 80.0–100.0)
Platelets: 200 10*3/uL (ref 150–400)
RBC: 4.46 MIL/uL (ref 3.87–5.11)
RDW: 16.7 % — ABNORMAL HIGH (ref 11.5–15.5)
WBC: 6.2 10*3/uL (ref 4.0–10.5)
nRBC: 0 % (ref 0.0–0.2)

## 2019-01-30 LAB — ETHANOL: Alcohol, Ethyl (B): <10 mg/dL (ref <10)

## 2019-01-30 LAB — I-STAT BETA HCG BLOOD, ED (MC, WL, AP ONLY): I-stat hCG, quantitative: <5 m[IU]/mL (ref <5)

## 2019-01-30 LAB — ACETAMINOPHEN LEVEL: Acetaminophen (Tylenol), Serum: <10 ug/mL — ABNORMAL LOW (ref 10–30)

## 2019-01-30 LAB — SALICYLATE LEVEL: Salicylate Lvl: <7 mg/dL (ref 2.8–30.0)

## 2019-01-30 MED ORDER — METFORMIN HCL 500 MG PO TABS
500.0000 mg | ORAL_TABLET | Freq: Two times a day (BID) | ORAL | Status: DC
  Administered 2019-01-30 – 2019-01-31 (×3): 500 mg via ORAL
  Filled 2019-01-30 (×3): qty 1

## 2019-01-30 MED ORDER — PANTOPRAZOLE SODIUM 40 MG PO TBEC
40.0000 mg | DELAYED_RELEASE_TABLET | Freq: Every day | ORAL | Status: DC
  Administered 2019-01-30 – 2019-01-31 (×2): 40 mg via ORAL
  Filled 2019-01-30 (×2): qty 1

## 2019-01-30 MED ORDER — CARVEDILOL 12.5 MG PO TABS
25.0000 mg | ORAL_TABLET | Freq: Two times a day (BID) | ORAL | Status: DC
  Administered 2019-01-30 – 2019-01-31 (×3): 25 mg via ORAL
  Filled 2019-01-30 (×3): qty 2

## 2019-01-30 MED ORDER — ATORVASTATIN CALCIUM 10 MG PO TABS
10.0000 mg | ORAL_TABLET | Freq: Every day | ORAL | Status: DC
  Administered 2019-01-30 – 2019-01-31 (×2): 10 mg via ORAL
  Filled 2019-01-30 (×2): qty 1

## 2019-01-30 MED ORDER — CITALOPRAM HYDROBROMIDE 10 MG PO TABS
20.0000 mg | ORAL_TABLET | Freq: Every day | ORAL | Status: DC
  Administered 2019-01-30 – 2019-01-31 (×2): 20 mg via ORAL
  Filled 2019-01-30 (×2): qty 2

## 2019-01-30 MED ORDER — POTASSIUM CHLORIDE CRYS ER 20 MEQ PO TBCR
10.0000 meq | EXTENDED_RELEASE_TABLET | Freq: Once | ORAL | Status: AC
  Administered 2019-01-30: 10 meq via ORAL
  Filled 2019-01-30: qty 1

## 2019-01-30 MED ORDER — ASPIRIN EC 81 MG PO TBEC
81.0000 mg | DELAYED_RELEASE_TABLET | Freq: Two times a day (BID) | ORAL | Status: DC
  Administered 2019-01-30 – 2019-01-31 (×2): 81 mg via ORAL
  Filled 2019-01-30 (×5): qty 1

## 2019-01-30 MED ORDER — TORSEMIDE 20 MG PO TABS
10.0000 mg | ORAL_TABLET | Freq: Every day | ORAL | Status: DC
  Administered 2019-01-30 – 2019-01-31 (×2): 10 mg via ORAL
  Filled 2019-01-30 (×2): qty 1

## 2019-01-30 MED ORDER — ALBUTEROL SULFATE HFA 108 (90 BASE) MCG/ACT IN AERS: 2.0000 | INHALATION_SPRAY | Freq: Four times a day (QID) | RESPIRATORY_TRACT | Status: DC | PRN

## 2019-01-30 MED ORDER — IRBESARTAN 75 MG PO TABS
75.0000 mg | ORAL_TABLET | Freq: Every day | ORAL | Status: DC
  Administered 2019-01-30 – 2019-01-31 (×2): 75 mg via ORAL
  Filled 2019-01-30 (×3): qty 1

## 2019-01-30 NOTE — ED Triage Notes (Signed)
Pt arrives POV for eval of SI. Pt reports SI x 2 weeks, states worst today. Denies plan or precipitating event, denies AVH. Denies hx of same, Pt is tearful and flat in triage, alert, cooperative. GCS 15 Denies ETOH or drug use today

## 2019-01-30 NOTE — ED Provider Notes (Signed)
[facility] EMERGENCY DEPARTMENT Provider Note   CSN: [id number] Arrival date & time: 01/30/19  1348    History   Chief Complaint Chief Complaint  Patient presents with  . Suicidal    HPI Lori Carter is a 53 y.o. female.     HPI Patient presents concern of suicidal ideation. Patient states that she has a history of anxiety, depression, has stopped taking her medication, quite some time ago. She notes that over the past few days she has been increasingly despondent, hopeless, with thoughts of killing herself, though without a clear plan. She denies physical pain, complaints.  Past Medical History:  Diagnosis Date  . Acne   . Anemia 02/17/2018  . Anxiety   . Aortic occlusion (HCC) 02/08/2017  . Atopic neurodermatitis   . Cardiomegaly   . Cardiomyopathy (HCC) 04/23/2016  . Cellulitis of toe of left foot    acute  . Complication of anesthesia    woke up during surgery one  20 some years ago  . COPD (chronic obstructive pulmonary disease) (HCC)   . Diabetic neuropathy (HCC)    fingers  . Dyspnea    with exertion  . Dysthymic disorder   . Epidermal inclusion cyst   . Essential hypertension 04/23/2016  . Gastric mass   . Gastric ulcer 02/17/2018  . GERD (gastroesophageal reflux disease)    not taking anything  . Heart murmur    told years ago- , no one has said anything in years  . Hyperlipidemia   . Hypertension, benign    chronic  . Obesity 04/23/2016  . Obstructive sleep apnea    uses cpap set on 13 uses 1/2 of the tiem   . OSA (obstructive sleep apnea) 02/17/2018  . Tobacco abuse 04/23/2016  . Type 2 diabetes mellitus without complication (HCC)    chronic    Patient Active Problem List   Diagnosis Date Noted  . Type 2 diabetes mellitus (HCC) 03/16/2018  . Aortic regurgitation 03/15/2018  . Mitral regurgitation 03/15/2018  . Cardiomegaly 02/17/2018  . GERD (gastroesophageal reflux disease) 02/17/2018  . Gastric ulcer 02/17/2018   . Anemia 02/17/2018  . OSA (obstructive sleep apnea) 02/17/2018  . Gastric mass   . Aortic occlusion (HCC) 02/08/2017  . Cardiomyopathy (HCC) 04/23/2016  . Hypertensive heart disease with heart failure (HCC) 04/23/2016  . Obesity 04/23/2016  . Tobacco abuse 04/23/2016    Past Surgical History:  Procedure Laterality Date  . AORTA - BILATERAL FEMORAL ARTERY BYPASS GRAFT N/A 02/08/2017   Procedure: AORTA BIFEMORAL BYPASS GRAFT;  Surgeon: Larina Earthly, MD;  Location: [facility];  Service: Vascular;  Laterality: N/A;  . EUS N/A 09/09/2017   Procedure: UPPER ENDOSCOPIC ULTRASOUND (EUS) LINEAR;  Surgeon: Rachael Fee, MD;  Location: [facility] ENDOSCOPY;  Service: Endoscopy;  Laterality: N/A;  . none    . NOVASURE ABLATION    . SHOULDER SURGERY Left    rotator cuff  . TUBAL LIGATION       OB History   No obstetric history on file.      Home Medications    Prior to Admission medications   Medication Sig Start Date End Date Taking? Authorizing Provider  albuterol (PROVENTIL HFA;VENTOLIN HFA) 108 (90 Base) MCG/ACT inhaler Inhale 2 puffs into the lungs every 6 (six) hours as needed for wheezing or shortness of breath.   Yes [provider]  aspirin EC 81 MG EC tablet Take 1 tablet (81 mg total) by mouth 2 (two)  times daily. 03/31/18  Yes Baldo DaubMunley, Brian J, MD  atorvastatin (LIPITOR) 10 MG tablet Take 10 mg by mouth daily.   Yes [provider]  carvedilol (COREG) 25 MG tablet Take 25 mg by mouth 2 (two) times daily with a meal.   Yes [provider]  citalopram (CELEXA) 20 MG tablet Take 20 mg by mouth daily.   Yes [provider]  metFORMIN (GLUCOPHAGE) 500 MG tablet Take 500 mg by mouth 2 (two) times daily with a meal.   Yes [provider]  omeprazole (PRILOSEC) 20 MG capsule Take 20 mg by mouth daily.   Yes [provider]  potassium chloride (MICRO-K) 10 MEQ CR capsule Take 1 capsule by mouth daily. 06/19/18  Yes [provider]   telmisartan (MICARDIS) 40 MG tablet Take 1 tablet (40 mg total) by mouth daily. 07/14/18  Yes Baldo DaubMunley, Brian J, MD  torsemide (DEMADEX) 20 MG tablet Take 1 tablet (20 mg) twice daily. Take an extra tablet (20 mg) in the morning on M,W,F. 07/14/18  Yes Munley, Iline OvenBrian J, MD    Family History Family History  Problem Relation Age of Onset  . Hypertension Mother   . Clotting disorder Mother   . Heart murmur Father   . Pancreatic cancer Father   . Diabetes Father   . Hypertension Father     Social History Social History   Tobacco Use  . Smoking status: Light Tobacco Smoker    Packs/day: 0.33    Years: 29.00    Pack years: 9.57  . Smokeless tobacco: Never Used  . Tobacco comment: 3 cigarettes per day  Substance Use Topics  . Alcohol use: No  . Drug use: No     Allergies   Patient has no known allergies.   Review of Systems Review of Systems  Constitutional:       Per HPI, otherwise negative  HENT:       Per HPI, otherwise negative  Respiratory:       Per HPI, otherwise negative  Cardiovascular:       Per HPI, otherwise negative  Gastrointestinal: Negative for vomiting.  Endocrine:       Negative aside from HPI  Genitourinary:       Neg aside from HPI   Musculoskeletal:       Per HPI, otherwise negative  Skin: Negative.   Neurological: Negative for syncope.  Psychiatric/Behavioral: Positive for dysphoric mood and suicidal ideas.     Physical Exam Updated Vital Signs BP (!) 179/92 (BP Location: Right Arm)   Pulse 90   Temp 98.5 F (36.9 C) (Oral)   Resp 18   Ht 5\' 6"  (1.676 m)   Wt 111.1 kg   SpO2 96%   BMI 39.54 kg/m   Physical Exam Vitals signs and nursing note reviewed.  Constitutional:      General: She is not in acute distress.    Appearance: She is well-developed.  HENT:     Head: Normocephalic and atraumatic.  Eyes:     Conjunctiva/sclera: Conjunctivae normal.  Cardiovascular:     Rate and Rhythm: Normal rate and regular rhythm.   Pulmonary:     Effort: Pulmonary effort is normal. No respiratory distress.     Breath sounds: Normal breath sounds. No stridor.  Abdominal:     General: There is no distension.  Skin:    General: Skin is warm and dry.  Neurological:     Mental Status: She is alert and oriented to  person, place, and time.     Cranial Nerves: No cranial nerve deficit.  Psychiatric:        Thought Content: Thought content includes suicidal ideation. Thought content does not include suicidal plan.      ED Treatments / Results  Labs (all labs ordered are listed, but only abnormal results are displayed) Labs Reviewed  COMPREHENSIVE METABOLIC PANEL - Abnormal; Notable for the following components:      Result Value   Potassium 3.3 (*)    Glucose, Bld 161 (*)    All other components within normal limits  ACETAMINOPHEN LEVEL - Abnormal; Notable for the following components:   Acetaminophen (Tylenol), Serum <10 (*)    All other components within normal limits  CBC - Abnormal; Notable for the following components:   RDW 16.7 (*)    All other components within normal limits  ETHANOL  SALICYLATE LEVEL  RAPID URINE DRUG SCREEN, HOSP PERFORMED  I-STAT BETA HCG BLOOD, ED ([facility], [facility], [facility] ONLY)    EKG None  Radiology No results found.  Procedures Procedures (including critical care time)  Medications Ordered in ED Medications  albuterol (VENTOLIN HFA) 108 (90 Base) MCG/ACT inhaler 2 puff (has no administration in time range)  aspirin EC tablet 81 mg (has no administration in time range)  atorvastatin (LIPITOR) tablet 10 mg (has no administration in time range)  carvedilol (COREG) tablet 25 mg (has no administration in time range)  citalopram (CELEXA) tablet 20 mg (has no administration in time range)  metFORMIN (GLUCOPHAGE) tablet 500 mg (has no administration in time range)  pantoprazole (PROTONIX) EC tablet 40 mg (has no administration in time range)  potassium chloride SA (K-DUR) CR tablet 10  mEq (has no administration in time range)  irbesartan (AVAPRO) tablet 75 mg (has no administration in time range)  torsemide (DEMADEX) tablet 10 mg (has no administration in time range)     Initial Impression / Assessment and Plan / ED Course  I have reviewed the triage vital signs and the nursing notes.  Pertinent labs & imaging results that were available during my care of the patient were reviewed by me and considered in my medical decision making (see chart for details).  This patient presents with the need for medical evaluation due to ongoing psychiatric condition.  The patient's medical portion of the evaluation is generally reassuring, with no evidence of acute new pathology.  The patient has been medically cleared for further psychiatric evaluation.  Final Clinical Impressions(s) / ED Diagnoses  Suicidal ideation   Gerhard Munch, MD 01/30/19 1929

## 2019-01-30 NOTE — ED Notes (Addendum)
Pt ambulatory from rm 3 to 20 with no assist. Pt making phone call to friend.

## 2019-01-30 NOTE — BH Assessment (Signed)
Tele Assessment Note   Patient Name: Jerika Wales MRN: [id number] Referring Physician: Dr. Jeraldine Loots Location of Patient: [facility] Location of Provider: Behavioral Health TTS Department  Tsering Genavieve Mangiapane is an 53 y.o. female.  -Clinician reviewed note by Dr. Jeraldine Loots.  Patient presents concern of suicidal ideation. Patient states that she has a history of anxiety, depression, has stopped taking her medication, quite some time ago. She notes that over the past few days she has been increasingly despondent, hopeless, with thoughts of killing herself, though without a clear plan. She denies physical pain, complaints.  Patient said that she has been feeling very depressed for the last year.  She said that for the last month it has gotten worse.  She said that she has had poor sleep and poor appetite.  When asked why she was brought to [facility] she said "I guess because of that note."  She admits to saying she "was ready to go."  When asked for clarification she said "I am ready to die."  Patient said she does not have a plan now.  She has had intention over the last month.  Patient denies any HI.  She says at time she feels like hitting her two adult children.  They do not live with her and she says she has poor relationship with them.  Patient denies any A/V hallucinations.  She does not use ETOH or illicit drugs.  Patient says that her PGM raised her and she died about a year ago.  PGM's birthmonth is June and this is depressing patient.  She also says she has a poor relationship with her daughter.  She says they can barely go half an hour around each other without arguing.  Patient said that this last Saturday (01/28/19) she got into an argument with both son and daughter and told them to get out of her house.  Patient says that she works 6 days a week.  Patient says that people come to her with their problems and she is now without anyone to go to with her problems.  Patient has no  outpatient or inpatient history.  She has been on depression medication before but has been off it for a month.  She says that she is not able to sleep at night, racing thoughts and only falling to sleep for a few hours at daybreak.  Patient reports a poor appetite but does not know if she has lost any weight.    -Clinician discussed patient care with Nira Conn, FNP.  He recommends inpatient care.    Diagnosis: F32.2 MDD single episode severe  Past Medical History:  Past Medical History:  Diagnosis Date  . Acne   . Anemia 02/17/2018  . Anxiety   . Aortic occlusion (HCC) 02/08/2017  . Atopic neurodermatitis   . Cardiomegaly   . Cardiomyopathy (HCC) 04/23/2016  . Cellulitis of toe of left foot    acute  . Complication of anesthesia    woke up during surgery one  20 some years ago  . COPD (chronic obstructive pulmonary disease) (HCC)   . Diabetic neuropathy (HCC)    fingers  . Dyspnea    with exertion  . Dysthymic disorder   . Epidermal inclusion cyst   . Essential hypertension 04/23/2016  . Gastric mass   . Gastric ulcer 02/17/2018  . GERD (gastroesophageal reflux disease)    not taking anything  . Heart murmur    told years ago- , no one has said anything in  years  . Hyperlipidemia   . Hypertension, benign    chronic  . Obesity 04/23/2016  . Obstructive sleep apnea    uses cpap set on 13 uses 1/2 of the tiem   . OSA (obstructive sleep apnea) 02/17/2018  . Tobacco abuse 04/23/2016  . Type 2 diabetes mellitus without complication (HCC)    chronic    Past Surgical History:  Procedure Laterality Date  . 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 EUS N/A 09/09/2017   Procedure: UPPER ENDOSCOPIC ULTRASOUND (EUS) LINEAR;  Surgeon: Rachael Fee, MD;  Location: [facility] ENDOSCOPY;  Service: Endoscopy;  Laterality: N/A;  . none    .  NOVASURE ABLATION    . SHOULDER SURGERY Left    rotator cuff  . TUBAL LIGATION      Family History:  Family History  Problem Relation Age of Onset  . Hypertension Mother   . Clotting disorder Mother   . Heart murmur Father   . Pancreatic cancer Father   . Diabetes Father   . Hypertension Father     Social History:  reports that she has been smoking. She has a 9.57 pack-year smoking history. She has never used smokeless tobacco. She reports that she does not drink alcohol or use drugs.  Additional Social History:  Alcohol / Drug Use Pain Medications: None Prescriptions: Has not taken any medication in over a month. Over the Counter: N/A History of alcohol / drug use?: No history of alcohol / drug abuse  CIWA: CIWA-Ar BP: (!) 179/92 Pulse Rate: 90 COWS:    Allergies: No Known Allergies  Home Medications: (Not in a hospital admission)   OB/GYN Status:  No LMP recorded. Patient has had an ablation.  General Assessment Data Assessment unable to be completed: Yes Reason for not completing assessment: Pt could not be seen earlier. Multiple assessments. Location of Assessment: [facility] ED TTS Assessment: In system Is this a Tele or Face-to-Face Assessment?: Tele Assessment Is this an Initial Assessment or a Re-assessment for this encounter?: Initial Assessment Patient Accompanied by:: N/A Language Other than English: No Living Arrangements: Other (Comment)(Has a roommate.) What gender do you identify as?: Female Marital status: Single Pregnancy Status: No Living Arrangements: Non-relatives/Friends Can pt return to current living arrangement?: Yes Admission Status: Voluntary Is patient capable of signing voluntary admission?: Yes Referral Source: Self/Family/Friend(Friend saw a note patient had written.  Friend brought her.) Insurance type: BC/BS     Crisis Care Plan Living Arrangements: Non-relatives/Friends Name of Psychiatrist: None Name of Therapist:  None  Education Status Is patient currently in school?: No Is the patient employed, unemployed or receiving disability?: Employed  Risk to self with the past 6 months Suicidal Ideation: Yes-Currently Present Has patient been a risk to self within the past 6 months prior to admission? : Yes(Just the last month.) Suicidal Intent: Yes-Currently Present Has patient had any suicidal intent within the past 6 months prior to admission? : No Is patient at risk for suicide?: Yes Suicidal Plan?: No Has patient had any suicidal plan within the past 6 months prior to admission? : No Access to Means: No What has been your use of drugs/alcohol within the last 12 months?: None Previous Attempts/Gestures: No How many times?: 0 Other Self Harm Risks: None Triggers for Past Attempts: None known Intentional Self Injurious Behavior: None Family Suicide  History: No Recent stressful life event(s): Loss (Comment), Conflict (Comment)(Grandmother died a year ago; conflict w/ children) Persecutory voices/beliefs?: No Depression: Yes Depression Symptoms: Despondent, Insomnia, Isolating, Loss of interest in usual pleasures, Feeling worthless/self pity, Tearfulness Substance abuse history and/or treatment for substance abuse?: No Suicide prevention information given to non-admitted patients: Not applicable  Risk to Others within the past 6 months Homicidal Ideation: No Does patient have any lifetime risk of violence toward others beyond the six months prior to admission? : No Thoughts of Harm to Others: No Current Homicidal Intent: No Current Homicidal Plan: No Access to Homicidal Means: No Identified Victim: No one History of harm to others?: No Assessment of Violence: In distant past Violent Behavior Description: A few in high school Does patient have access to weapons?: No Criminal Charges Pending?: No Does patient have a court date: No Is patient on probation?: No  Psychosis Hallucinations: None  noted Delusions: None noted  Mental Status Report Appearance/Hygiene: Unremarkable, In scrubs Eye Contact: Fair Motor Activity: Freedom of movement, Unremarkable Speech: Logical/coherent Level of Consciousness: Alert Mood: Depressed, Despair, Sad Affect: Depressed, Sad Anxiety Level: Panic Attacks Panic attack frequency: 1-2 times per week Most recent panic attack: this weekend Thought Processes: Coherent, Relevant Judgement: Unimpaired Orientation: Person, Place, Time, Situation Obsessive Compulsive Thoughts/Behaviors: None  Cognitive Functioning Concentration: Fair Memory: Recent Impaired, Remote Intact Is patient IDD: No Insight: Good Impulse Control: Fair Appetite: Fair Have you had any weight changes? : No Change Sleep: Decreased Total Hours of Sleep: (<4H/D) Vegetative Symptoms: None  ADLScreening [facility] Assessment Services) Patient's cognitive ability adequate to safely complete daily activities?: Yes Patient able to express need for assistance with ADLs?: Yes Independently performs ADLs?: Yes (appropriate for developmental age)  Prior Inpatient Therapy Prior Inpatient Therapy: No  Prior Outpatient Therapy Prior Outpatient Therapy: No Does patient have an ACCT team?: No Does patient have Intensive In-House Services?  : No Does patient have [facility] services? : No Does patient have P4CC services?: No  ADL Screening (condition at time of admission) Patient's cognitive ability adequate to safely complete daily activities?: Yes Is the patient deaf or have difficulty hearing?: No Does the patient have difficulty seeing, even when wearing glasses/contacts?: Yes(Wears glasses.  needs to get eyes checked.) Does the patient have difficulty concentrating, remembering, or making decisions?: Yes Patient able to express need for assistance with ADLs?: Yes Does the patient have difficulty dressing or bathing?: No Independently performs ADLs?: Yes (appropriate for  developmental age) Does the patient have difficulty walking or climbing stairs?: Yes(Had previous surgery on blocked artieries in legs.) Weakness of Legs: Both Weakness of Arms/Hands: None  Home Assistive Devices/Equipment Home Assistive Devices/Equipment: None    Abuse/Neglect Assessment (Assessment to be complete while patient is alone) Abuse/Neglect Assessment Can Be Completed: Yes Physical Abuse: Denies Verbal Abuse: Yes, past (Comment) Sexual Abuse: Denies Exploitation of patient/patient's resources: Denies Self-Neglect: Denies     Merchant navy officer (For Healthcare) Does Patient Have a Medical Advance Directive?: Yes Type of Advance Directive: Healthcare Power of Attorney, Living will Copy of Healthcare Power of Attorney in Chart?: No - copy requested Copy of Living Will in Chart?: No - copy requested Would patient like information on creating a medical advance directive?: No - Patient declined          Disposition:  Disposition Initial Assessment Completed for this Encounter: Yes Patient referred to: Other (Comment)(Can come to [facility] pending bed availability on 06/02.)  This service was provided via telemedicine using a 2-way, interactive  audio and Immunologistvideo technology.  Names of all persons participating in this telemedicine service and their role in this encounter. Name: Waldo LaineSonya Patton Role: patient  Name: Allayne Butchereresa Hatchett Role: friend  Name: Beatriz StallionMarcus Malley Hauter, M.S. LCAS QP Role: clinician  Name:  Role:    Alexandria LodgeHarvey, Jaycelyn Orrison Ray 01/30/2019 8:50 PM

## 2019-01-30 NOTE — ED Notes (Signed)
Lori Carter, patient's friend that brought her in wanted to leave her contact information so that she can provide information for the reason for patient's visit today if needed [phone]

## 2019-01-30 NOTE — ED Notes (Signed)
Pt changed into purple scrubs, belongings removed from room. Called staffing for sitter, no sitter available. Pt door is open and in view of nurses station. Contracted for safety at this time

## 2019-01-30 NOTE — ED Notes (Signed)
Reg Diet tray ordered for pt.

## 2019-01-30 NOTE — BH Assessment (Signed)
[facility] Assessment Progress Note   Clock stopped.  Previous shift had multiple walk-ins.

## 2019-01-30 NOTE — BH Assessment (Signed)
[facility] Assessment Progress Note   Per [facility] Fransico Michael, patient has been accepted to [facility] pending bed availability tomorrow (06/02).  Daytime AC to coordinate patient transfer.  Clinician informed Gabriel Rung, RN.

## 2019-01-30 NOTE — ED Notes (Signed)
TTS in process 

## 2019-01-30 NOTE — ED Notes (Signed)
Sitter at bedside.

## 2019-01-31 ENCOUNTER — Inpatient Hospital Stay (HOSPITAL_COMMUNITY)
Admission: AD | Admit: 2019-01-31 | Discharge: 2019-02-02 | DRG: 885 | Disposition: A | Discharge status: Home / Self Care | Payer: BC Managed Care – PPO | Source: Intra-hospital | Attending: Psychiatry | Admitting: Psychiatry

## 2019-01-31 ENCOUNTER — Encounter (HOSPITAL_COMMUNITY): Payer: Self-pay

## 2019-01-31 ENCOUNTER — Other Ambulatory Visit: Payer: Self-pay | Admitting: Behavioral Health

## 2019-01-31 ENCOUNTER — Other Ambulatory Visit: Payer: Self-pay

## 2019-01-31 DIAGNOSIS — Z1159 Encounter for screening for other viral diseases: Secondary | ICD-10-CM | POA: Diagnosis not present

## 2019-01-31 DIAGNOSIS — R45851 Suicidal ideations: Secondary | ICD-10-CM | POA: Diagnosis present

## 2019-01-31 DIAGNOSIS — F17210 Nicotine dependence, cigarettes, uncomplicated: Secondary | ICD-10-CM | POA: Diagnosis present

## 2019-01-31 DIAGNOSIS — E1140 Type 2 diabetes mellitus with diabetic neuropathy, unspecified: Secondary | ICD-10-CM | POA: Diagnosis present

## 2019-01-31 DIAGNOSIS — K219 Gastro-esophageal reflux disease without esophagitis: Secondary | ICD-10-CM | POA: Diagnosis present

## 2019-01-31 DIAGNOSIS — I110 Hypertensive heart disease with heart failure: Secondary | ICD-10-CM | POA: Diagnosis present

## 2019-01-31 DIAGNOSIS — F322 Major depressive disorder, single episode, severe without psychotic features: Secondary | ICD-10-CM | POA: Diagnosis not present

## 2019-01-31 DIAGNOSIS — G4700 Insomnia, unspecified: Secondary | ICD-10-CM | POA: Diagnosis present

## 2019-01-31 DIAGNOSIS — F329 Major depressive disorder, single episode, unspecified: Secondary | ICD-10-CM | POA: Diagnosis present

## 2019-01-31 LAB — SARS CORONAVIRUS 2 BY RT PCR (HOSPITAL ORDER, PERFORMED IN ~~LOC~~ HOSPITAL LAB): SARS Coronavirus 2: NEGATIVE

## 2019-01-31 MED ORDER — IRBESARTAN 75 MG PO TABS
75.0000 mg | ORAL_TABLET | Freq: Every day | ORAL | Status: DC
  Administered 2019-02-01 – 2019-02-02 (×2): 75 mg via ORAL
  Filled 2019-01-31 (×4): qty 1

## 2019-01-31 MED ORDER — PANTOPRAZOLE SODIUM 40 MG PO TBEC
40.0000 mg | DELAYED_RELEASE_TABLET | Freq: Every day | ORAL | Status: DC
  Administered 2019-02-01 – 2019-02-02 (×2): 40 mg via ORAL
  Filled 2019-01-31 (×4): qty 1

## 2019-01-31 MED ORDER — METFORMIN HCL 500 MG PO TABS
500.0000 mg | ORAL_TABLET | Freq: Two times a day (BID) | ORAL | Status: DC
  Administered 2019-02-01 – 2019-02-02 (×3): 500 mg via ORAL
  Filled 2019-01-31 (×7): qty 1

## 2019-01-31 MED ORDER — ASPIRIN EC 81 MG PO TBEC
81.0000 mg | DELAYED_RELEASE_TABLET | Freq: Two times a day (BID) | ORAL | Status: DC
  Administered 2019-02-01 – 2019-02-02 (×3): 81 mg via ORAL
  Filled 2019-01-31 (×8): qty 1

## 2019-01-31 MED ORDER — TORSEMIDE 10 MG PO TABS
10.0000 mg | ORAL_TABLET | Freq: Every day | ORAL | Status: DC
  Administered 2019-02-01 – 2019-02-02 (×2): 10 mg via ORAL
  Filled 2019-01-31 (×4): qty 1

## 2019-01-31 MED ORDER — CARVEDILOL 25 MG PO TABS
25.0000 mg | ORAL_TABLET | Freq: Two times a day (BID) | ORAL | Status: DC
  Administered 2019-02-01 – 2019-02-02 (×3): 25 mg via ORAL
  Filled 2019-01-31 (×7): qty 1

## 2019-01-31 MED ORDER — TRAZODONE HCL 50 MG PO TABS: 50.0000 mg | ORAL_TABLET | Freq: Every evening | ORAL | Status: DC | PRN

## 2019-01-31 MED ORDER — CITALOPRAM HYDROBROMIDE 20 MG PO TABS
20.0000 mg | ORAL_TABLET | Freq: Every day | ORAL | Status: DC
  Administered 2019-02-01 – 2019-02-02 (×2): 20 mg via ORAL
  Filled 2019-01-31 (×4): qty 1

## 2019-01-31 MED ORDER — ALUM & MAG HYDROXIDE-SIMETH 200-200-20 MG/5ML PO SUSP: 30.0000 mL | ORAL | Status: DC | PRN

## 2019-01-31 MED ORDER — ACETAMINOPHEN 325 MG PO TABS: 650.0000 mg | ORAL_TABLET | Freq: Four times a day (QID) | ORAL | Status: DC | PRN

## 2019-01-31 MED ORDER — ATORVASTATIN CALCIUM 10 MG PO TABS
10.0000 mg | ORAL_TABLET | Freq: Every day | ORAL | Status: DC
  Administered 2019-02-01 – 2019-02-02 (×2): 10 mg via ORAL
  Filled 2019-01-31 (×4): qty 1

## 2019-01-31 MED ORDER — MAGNESIUM HYDROXIDE 400 MG/5ML PO SUSP: 30.0000 mL | Freq: Every day | ORAL | Status: DC | PRN

## 2019-01-31 MED ORDER — HYDROXYZINE HCL 25 MG PO TABS: 25.0000 mg | ORAL_TABLET | Freq: Three times a day (TID) | ORAL | Status: DC | PRN

## 2019-01-31 MED ORDER — ALBUTEROL SULFATE HFA 108 (90 BASE) MCG/ACT IN AERS: 2.0000 | INHALATION_SPRAY | Freq: Four times a day (QID) | RESPIRATORY_TRACT | Status: DC | PRN

## 2019-01-31 NOTE — ED Notes (Signed)
Pt refuses transport due to belongings. Charge nurse aware.

## 2019-01-31 NOTE — ED Notes (Signed)
Please see safety zone in reference to belongings.

## 2019-01-31 NOTE — ED Notes (Signed)
Spoke with EDP, Consulting civil engineer, AD Johnston Ebbs about [employer]. Since patient is in cone system we do not have to complete it. Confirmed with EDP Sophia C, PA-C

## 2019-01-31 NOTE — ED Notes (Signed)
Pt now refuses transport to [facility]. Dr. Rosalia Hammers made aeware.

## 2019-01-31 NOTE — ED Notes (Signed)
Attempted to call [facility]. Phone kept ringing

## 2019-01-31 NOTE — ED Notes (Signed)
Pt declines dinner tray but eats [place] asandwhich

## 2019-01-31 NOTE — ED Notes (Signed)
Patients daughter called RN and requested to speak with patient. Patient states she does not want to talk to her and "she is the reason I am like this."

## 2019-01-31 NOTE — ED Notes (Signed)
Dinner tray odered 

## 2019-01-31 NOTE — Tx Team (Signed)
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   PATIENT STRENGTHS: Ability for insight Active sense of humor Average or above average intelligence Advertising account executive for treatment/growth Supportive family/friends Work skills   PATIENT IDENTIFIED PROBLEMS:   " I've been stressed that people (children) always wanting something from me all the time"  " I've been tearful and not sleeping"  " Mood has been down and thoughts to die"               DISCHARGE CRITERIA:  Adequate post-discharge living arrangements Reduction of life-threatening or endangering symptoms to within safe limits  PRELIMINARY DISCHARGE PLAN: Outpatient therapy Return to previous living arrangement  PATIENT/FAMILY INVOLVEMENT: This treatment plan has been presented to and reviewed with the patient, Lori Carter, and/or family member, .  The patient and family have been given the opportunity to ask questions and make suggestions.  Andres Ege, RN 01/31/2019, 11:19 PM

## 2019-01-31 NOTE — Progress Notes (Addendum)
Patient ID: Lori Carter, female   DOB: 01-Dec-1965, 53 y.o.   MRN: [id number]   Lori Carter is a 53 year old involuntary admission from [facility]. This is her first inpatient psych admission. She reports increased depression after her grandmother passed away last year. 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 Also reports poor sleep at home. She reports weight gain recently. She says she was having some suicidal thoughts but no plan. Reports that she has been on medication in the past for depression. Medical hx consists of: HTN, DM, Cardiomegaly, Hyperlipidemia, and asthma. She is a smoker but no drug or alcohol use. Currently has a roommate but they are not in a relationship. Ongoing problems with kids. Reports crying for no reason sometimes. Cooperative with admission process. Patient had an issue at [facility] with staff accidentally sending her belongings to [facility] but they are bringing her belongings here in am. Patient reports that she was so upset about this that she wanted to leave but they made her involuntary

## 2019-01-31 NOTE — ED Notes (Signed)
Called report to [facility] at [facility]

## 2019-01-31 NOTE — ED Notes (Signed)
Pt given her second phone call of the day. [facility] here to transport patient to [facility]

## 2019-01-31 NOTE — Progress Notes (Signed)
Admissions staff at [facility] contacted CSW earlier today and stated that they have pt's belongings (accidentally taken by [facility] when transporting another pt from [facility] ED). They will mail pt's belongings tomorrow morning to [facility]. 1st shift Disposition CSW's contact information was provided as a contact.   Wells Guiles, LCSW, LCAS Disposition CSW [facility]/TTS [phone]

## 2019-01-31 NOTE — ED Notes (Signed)
Pt speaking on phone tearful at times. After call states she does not wish to spek with family members.

## 2019-01-31 NOTE — Progress Notes (Signed)
Pt accepted to [facility], Bed 404-1 Lori Carter. NP is the accepting provider.  Lori Mellow, MD is the attending provider.  Call report to  [phone]  [facility] @ [facility] Psych ED notified.   Pt is Voluntary.  Pt may be transported by [facility]  Pt scheduled  to arrive at [name] @ 12:00 [name] [name], MSW, LCSW Disposition Clinical Social Work [phone] (cell) [phone] (office)

## 2019-01-31 NOTE — ED Notes (Signed)
Faxed IVC paper work to [facility] fax number

## 2019-01-31 NOTE — ED Notes (Signed)
Called [facility] [facility] to arrange transport for patient to [facility]

## 2019-01-31 NOTE — ED Provider Notes (Signed)
Emergency Medicine Observation Re-evaluation Note  Lori Carter is a 53 y.o. female, seen on rounds today.  Pt initially presented to the ED for complaints of Suicidal Currently, the patient is resting in bed, comfortable in no obvious distress.  She is aware she is awaiting transfer to behavioral health hospital..  Physical Exam  BP 128/78 (BP Location: Left Arm)   Pulse 81   Temp 98 F (36.7 C) (Oral)   Resp 18   Ht 5\' 6"  (1.676 m)   Wt 111.1 kg   SpO2 100%   BMI 39.54 kg/m  Physical Exam Vitals signs and nursing note reviewed.  Constitutional:      General: She is not in acute distress.    Appearance: She is not ill-appearing.  Neurological:     General: No focal deficit present.     Mental Status: She is alert.     ED Course / MDM  EAV:WUJW   I have reviewed the labs performed to date as well as medications administered while in observation.  Recent changes in the last 24 hours include None since H/P. Plan  Current plan is for transfer today to [facility]. Patient is not under full IVC at this time.   Cristina Gong, PA-C 01/31/19 1025    Vanetta Mulders, MD 02/01/19 [phone]

## 2019-02-01 DIAGNOSIS — F322 Major depressive disorder, single episode, severe without psychotic features: Principal | ICD-10-CM

## 2019-02-01 LAB — LIPID PANEL
Cholesterol: 162 mg/dL (ref 0–200)
HDL: 32 mg/dL — ABNORMAL LOW (ref >40)
LDL Cholesterol: 105 mg/dL — ABNORMAL HIGH (ref 0–99)
Total CHOL/HDL Ratio: 5.1 RATIO
Triglycerides: 124 mg/dL (ref <150)
VLDL: 25 mg/dL (ref 0–40)

## 2019-02-01 LAB — TSH: TSH: 1.051 u[IU]/mL (ref 0.350–4.500)

## 2019-02-01 LAB — GLUCOSE, CAPILLARY: Glucose-Capillary: 124 mg/dL — ABNORMAL HIGH (ref 70–99)

## 2019-02-01 LAB — HEMOGLOBIN A1C
Hgb A1c MFr Bld: 6.8 % — ABNORMAL HIGH (ref 4.8–5.6)
Mean Plasma Glucose: 148.46 mg/dL

## 2019-02-01 MED ORDER — NICOTINE 21 MG/24HR TD PT24
21.0000 mg | MEDICATED_PATCH | Freq: Every day | TRANSDERMAL | Status: DC
  Administered 2019-02-01 – 2019-02-02 (×2): 21 mg via TRANSDERMAL
  Filled 2019-02-01 (×4): qty 1

## 2019-02-01 MED ORDER — POTASSIUM CHLORIDE CRYS ER 20 MEQ PO TBCR
20.0000 meq | EXTENDED_RELEASE_TABLET | Freq: Every day | ORAL | Status: DC
  Administered 2019-02-01 – 2019-02-02 (×2): 20 meq via ORAL
  Filled 2019-02-01 (×3): qty 1

## 2019-02-01 NOTE — Plan of Care (Signed)
  D: Pt alert and oriented on the unit. Pt's affect was flat and she was teary-eyed at times. Pt c/o the hospital misplacing her purse by accidentally sending it with the belongings of another pt to [facility]. RN processed with pt and provided reassurance. RN talked with [facility] and CSW and was informed that [facility] mailed her purse for over-night delivery. RN relayed the message to pt. Pt is pleasant and cooperative.  A: Education, support and encouragement provided, q15 minute safety checks remain in effect. Medications administered per MD orders.  R: No reactions/side effects to medicine noted. Pt denies any concerns at this time, and verbally contracts for safety. Pt ambulating on the unit with no issues. Pt remains safe on and off the unit.   Problem: Safety: Goal: Periods of time without injury will increase Outcome: Progressing   Problem: Activity: Goal: Interest or engagement in leisure activities will improve Outcome: Progressing

## 2019-02-01 NOTE — BHH Counselor (Signed)
Adult Comprehensive Assessment  Patient ID: Wryn Fenech, female   DOB: 01-12-1966, 53 y.o.   MRN: [id number]  Information Source: Information source: Patient  Current Stressors:  Patient states their primary concerns and needs for treatment are:: "I wrote something stupid and my doctor told me to come to the hospital"  Patient states their goals for this hospitilization and ongoing recovery are:: "I just want to go"  Educational / Learning stressors: N/A  Employment / Job issues: Employed; Patient denies any current stressors  Family Relationships:  Patient denies any current stressors  Financial / Lack of resources (include bankruptcy):  Patient denies any current stressors  Housing / Lack of housing: Lives in her home with a roommate;  Patient denies any current stressors  Physical health (include injuries & life threatening diseases): Patient reports having multiple physical health issues. Please see chart for additional information.  Social relationships:  Patient denies any current stressors  Substance abuse:  Patient denies any current stressors  Bereavement / Loss:  Patient denies any current stressors   Living/Environment/Situation:  Living Arrangements: Non-relatives/Friends Living conditions (as described by patient or guardian): "Good"  Who else lives in the home?: one roommate  How long has patient lived in current situation?: 20 years  What is atmosphere in current home: Comfortable  Family History:  Marital status: Single Are you sexually active?: No What is your sexual orientation?: Heterosexual  Has your sexual activity been affected by drugs, alcohol, medication, or emotional stress?: No  Does patient have children?: Yes How many children?: 2 How is patient's relationship with their children?: Patient reports she does not have a good relationship with her two adult children.   Childhood History:  By whom was/is the patient raised?:  Grandparents Description of patient's relationship with caregiver when they were a child: Patient reports she had a perfect relationship  Patient's description of current relationship with people who raised him/her: Patient reports her grandmother is currently deceased  How were you disciplined when you got in trouble as a child/adolescent?: Whoopings and verbally  Does patient have siblings?: Yes Number of Siblings: 4 Description of patient's current relationship with siblings: Patient reports having a good relationship with her her two sisters, however she states she and her brothers do not have a good relationship  Did patient suffer any verbal/emotional/physical/sexual abuse as a child?: No Did patient suffer from severe childhood neglect?: No Has patient ever been sexually abused/assaulted/raped as an adolescent or adult?: No Was the patient ever a victim of a crime or a disaster?: No Witnessed domestic violence?: No Has patient been effected by domestic violence as an adult?: No  Education:  Highest grade of school patient has completed: Some college  Currently a Consulting civil engineer?: No Learning disability?: No  Employment/Work Situation:   Employment situation: Employed 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?: No Are There Guns or Other Weapons in Your Home?: No  Financial Resources:   Financial resources: Income from employment, Private insurance Does patient have a representative payee or guardian?: No  Alcohol/Substance Abuse:   What has been your use of drugs/alcohol within the last 12 months?: Denies  If attempted suicide, did drugs/alcohol play a role in  this?:  No Alcohol/Substance Abuse Treatment Hx: Denies past history Has alcohol/substance abuse ever caused legal problems?: No  Social Support System:   Forensic psychologistatient's Community Support System: Poor Type of faith/religion: Methodist  How does patient's faith help to cope with current illness?: Prayer   Leisure/Recreation:   Leisure and Hobbies: "No"   Strengths/Needs:   What is the patient's perception of their strengths?: "Patience"  Patient states they can use these personal strengths during their treatment to contribute to their recovery: Yes  Patient states these barriers may affect/interfere with their treatment: No  Patient states these barriers may affect their return to the community: No  Other important information patient would like considered in planning for their treatment: No   Discharge Plan:   Currently receiving community mental health services: No Patient states concerns and preferences for aftercare planning are: Patient declined any outpatient services  Patient states they will know when they are safe and ready for discharge when: Yes, as soon as possible  Does patient have access to transportation?: Yes Does patient have financial barriers related to discharge medications?: No Will patient be returning to same living situation after discharge?: Yes  Summary/Recommendations:   Summary and Recommendations (to be completed by the evaluator): Lamar LaundrySonya is a 53 year old female who is diagnsoed with MDD (major depressive disorder). She presented to the hospital seeking treatment with concern for suicidal ideation. Lamar LaundrySonya was pleasant and cooperative with providing information. Lamar LaundrySonya reports that she was depressed and that she have experienced an increase in symptoms since she stopped taking her medications for the last 30 days. Lamar LaundrySonya reports she does not want any outpatient follow up at discharge. Lamar LaundrySonya can benefit from crisis stabilization, medication management, therapeutic milieu and  referral services.   Maeola SarahJolan E Damaris Geers. 02/01/2019

## 2019-02-01 NOTE — Tx Team (Signed)
Interdisciplinary Treatment and Diagnostic Plan Update  02/01/2019 Time of Session: 9:00am Lori Carter MRN: [id number]  Principal Diagnosis: <principal problem not specified>  Secondary Diagnoses: Active Problems:   MDD (major depressive disorder)   Current Medications:  Current Facility-Administered Medications  Medication Dose Route Frequency Provider Last Rate Last Dose  . acetaminophen (TYLENOL) tablet 650 mg  650 mg Oral Q6H PRN Mordecai Maes, NP      . albuterol (VENTOLIN HFA) 108 (90 Base) MCG/ACT inhaler 2 puff  2 puff Inhalation Q6H PRN Lindon Romp A, NP      . alum & mag hydroxide-simeth (MAALOX/MYLANTA) 200-200-20 MG/5ML suspension 30 mL  30 mL Oral Q4H PRN Lindon Romp A, NP      . aspirin EC tablet 81 mg  81 mg Oral BID Lindon Romp A, NP   81 mg at 02/01/19 0808  . atorvastatin (LIPITOR) tablet 10 mg  10 mg Oral Daily Lindon Romp A, NP   10 mg at 02/01/19 0809  . carvedilol (COREG) tablet 25 mg  25 mg Oral BID WC Lindon Romp A, NP   25 mg at 02/01/19 0808  . citalopram (CELEXA) tablet 20 mg  20 mg Oral Daily Lindon Romp A, NP   20 mg at 02/01/19 8921  . hydrOXYzine (ATARAX/VISTARIL) tablet 25 mg  25 mg Oral TID PRN Lindon Romp A, NP      . irbesartan (AVAPRO) tablet 75 mg  75 mg Oral Daily Lindon Romp A, NP   75 mg at 02/01/19 0809  . magnesium hydroxide (MILK OF MAGNESIA) suspension 30 mL  30 mL Oral Daily PRN Lindon Romp A, NP      . metFORMIN (GLUCOPHAGE) tablet 500 mg  500 mg Oral BID WC Lindon Romp A, NP   500 mg at 02/01/19 0808  . nicotine (NICODERM CQ - dosed in mg/24 hours) patch 21 mg  21 mg Transdermal Daily Lindon Romp A, NP   21 mg at 02/01/19 1941  . pantoprazole (PROTONIX) EC tablet 40 mg  40 mg Oral Daily Lindon Romp A, NP   40 mg at 02/01/19 7408  . potassium chloride SA (K-DUR) CR tablet 20 mEq  20 mEq Oral Daily Sharma Covert, MD      . torsemide [facility]) tablet 10 mg  10 mg Oral Daily Lindon Romp A, NP   10 mg at 02/01/19  1448  . traZODone (DESYREL) tablet 50 mg  50 mg Oral QHS PRN Rozetta Nunnery, NP       PTA Medications: Medications Prior to Admission  Medication Sig Dispense Refill Last Dose  . albuterol (PROVENTIL HFA;VENTOLIN HFA) 108 (90 Base) MCG/ACT inhaler Inhale 2 puffs into the lungs every 6 (six) hours as needed for wheezing or shortness of breath.   Past Week at Unknown time  . aspirin EC 81 MG EC tablet Take 1 tablet (81 mg total) by mouth 2 (two) times daily. 30 tablet 3 Past Month at Unknown time  . atorvastatin (LIPITOR) 10 MG tablet Take 10 mg by mouth daily.   Past Month at Unknown time  . carvedilol (COREG) 25 MG tablet Take 25 mg by mouth 2 (two) times daily with a meal.   Past Month at Unknown time  . citalopram (CELEXA) 20 MG tablet Take 20 mg by mouth daily.   Past Month at Unknown time  . metFORMIN (GLUCOPHAGE) 500 MG tablet Take 500 mg by mouth 2 (two) times daily with a meal.   Past Month at  Unknown time  . omeprazole (PRILOSEC) 20 MG capsule Take 20 mg by mouth daily.   Past Month at Unknown time  . potassium chloride (MICRO-K) 10 MEQ CR capsule Take 1 capsule by mouth daily.  2 Past Month at Unknown time  . telmisartan (MICARDIS) 40 MG tablet Take 1 tablet (40 mg total) by mouth daily. 90 tablet 1 Past Month at Unknown time  . torsemide (DEMADEX) 20 MG tablet Take 1 tablet (20 mg) twice daily. Take an extra tablet (20 mg) in the morning on M,W,F. 270 tablet 1 Past Month at Unknown time    Patient Stressors: Loss of grandmother died last year  Patient Strengths: Ability for insight Active sense of humor Average or above average intelligence Child psychotherapist Motivation for treatment/growth Supportive family/friends Work skills  Treatment Modalities: Medication Management, Group therapy, Case management,  1 to 1 session with clinician, Psychoeducation, Recreational therapy.   Physician Treatment Plan for Primary Diagnosis: <principal problem not  specified> Long Term Goal(s): Improvement in symptoms so as ready for discharge Improvement in symptoms so as ready for discharge   Short Term Goals: Ability to identify changes in lifestyle to reduce recurrence of condition will improve Ability to verbalize feelings will improve Ability to disclose and discuss suicidal ideas Ability to demonstrate self-control will improve Ability to identify and develop effective coping behaviors will improve Ability to maintain clinical measurements within normal limits will improve Compliance with prescribed medications will improve Ability to identify changes in lifestyle to reduce recurrence of condition will improve Ability to verbalize feelings will improve Ability to disclose and discuss suicidal ideas Ability to demonstrate self-control will improve Ability to identify and develop effective coping behaviors will improve Ability to maintain clinical measurements within normal limits will improve Compliance with prescribed medications will improve  Medication Management: Evaluate patient's response, side effects, and tolerance of medication regimen.  Therapeutic Interventions: 1 to 1 sessions, Unit Group sessions and Medication administration.  Evaluation of Outcomes: Not Met  Physician Treatment Plan for Secondary Diagnosis: Active Problems:   MDD (major depressive disorder)  Long Term Goal(s): Improvement in symptoms so as ready for discharge Improvement in symptoms so as ready for discharge   Short Term Goals: Ability to identify changes in lifestyle to reduce recurrence of condition will improve Ability to verbalize feelings will improve Ability to disclose and discuss suicidal ideas Ability to demonstrate self-control will improve Ability to identify and develop effective coping behaviors will improve Ability to maintain clinical measurements within normal limits will improve Compliance with prescribed medications will  improve Ability to identify changes in lifestyle to reduce recurrence of condition will improve Ability to verbalize feelings will improve Ability to disclose and discuss suicidal ideas Ability to demonstrate self-control will improve Ability to identify and develop effective coping behaviors will improve Ability to maintain clinical measurements within normal limits will improve Compliance with prescribed medications will improve     Medication Management: Evaluate patient's response, side effects, and tolerance of medication regimen.  Therapeutic Interventions: 1 to 1 sessions, Unit Group sessions and Medication administration.  Evaluation of Outcomes: Not Met   RN Treatment Plan for Primary Diagnosis: <principal problem not specified> Long Term Goal(s): Knowledge of disease and therapeutic regimen to maintain health will improve  Short Term Goals: Ability to verbalize feelings will improve, Ability to identify and develop effective coping behaviors will improve and Compliance with prescribed medications will improve  Medication Management: RN will administer medications as ordered by provider, will assess  and evaluate patient's response and provide education to patient for prescribed medication. RN will report any adverse and/or side effects to prescribing provider.  Therapeutic Interventions: 1 on 1 counseling sessions, Psychoeducation, Medication administration, Evaluate responses to treatment, Monitor vital signs and CBGs as ordered, Perform/monitor CIWA, COWS, AIMS and Fall Risk screenings as ordered, Perform wound care treatments as ordered.  Evaluation of Outcomes: Not Met   LCSW Treatment Plan for Primary Diagnosis: <principal problem not specified> Long Term Goal(s): Safe transition to appropriate next level of care at discharge, Engage patient in therapeutic group addressing interpersonal concerns.  Short Term Goals: Engage patient in aftercare planning with referrals and  resources, Increase social support, Identify triggers associated with mental health/substance abuse issues and Increase skills for wellness and recovery  Therapeutic Interventions: Assess for all discharge needs, 1 to 1 time with Social worker, Explore available resources and support systems, Assess for adequacy in community support network, Educate family and significant other(s) on suicide prevention, Complete Psychosocial Assessment, Interpersonal group therapy.  Evaluation of Outcomes: Not Met   Progress in Treatment: Attending groups: Yes. Participating in groups: Yes. Taking medication as prescribed: Yes. Toleration medication: Yes. Family/Significant other contact made: No, will contact:  supports if consents are granted Patient understands diagnosis: Yes. Discussing patient identified problems/goals with staff: Yes. Medical problems stabilized or resolved: No. Denies suicidal/homicidal ideation: No. Issues/concerns per patient self-inventory: Yes.   New problem(s) identified: Yes, Describe:  family and financial stressors  New Short Term/Long Term Goal(s): medication management for mood stabilization; elimination of SI thoughts; development of comprehensive mental wellness/sobriety plan.  Patient Goals:    Discharge Plan or Barriers: CSW continuing to assess for appropriate referrals.   Reason for Continuation of Hospitalization: Anxiety Depression Medical Issues Suicidal ideation  Estimated Length of Stay: 5-7 days  Attendees: Patient: Lori Carter  02/01/2019 10:35 AM  Physician:  02/01/2019 10:35 AM  Nursing:  02/01/2019 10:35 AM  RN Care Manager: 02/01/2019 10:35 AM  Social Worker: Stephanie Acre, [place] 02/01/2019 10:35 AM  Recreational Therapist:  02/01/2019 10:35 AM  Other:  02/01/2019 10:35 AM  Other:  02/01/2019 10:35 AM  Other: 02/01/2019 10:35 AM    Scribe for Treatment Team: Joellen Jersey, [place] 02/01/2019 10:35 AM

## 2019-02-01 NOTE — [facility] Suicide Risk Assessment (Signed)
[facility] Admission Suicide Risk Assessment   Nursing information obtained from:  Patient Demographic factors:  NA Current Mental Status:  Self-harm thoughts Loss Factors:  Loss of significant relationship Historical Factors:  NA Risk Reduction Factors:  Positive social support, Employed, Living with another person, especially a relative  Total Time spent with patient: 30 minutes Principal Problem: <principal problem not specified> Diagnosis:  Active Problems:   MDD (major depressive disorder)  Subjective Data: Patient is seen and examined.  Patient is a 53 year old female with a past psychiatric history significant for major depression who presented to the [facility] emergency department on 01/30/2019 with concern for suicidal ideation.  The patient stated at that time that she has a history of depression and anxiety, but had stopped taking her medicines over 30 days ago.  She had previously been treated with Celexa.  She stated that over the last 30 days she had began to have crying spells, feeling helpless, hopeless and worthless.  She is began to think about harming herself.  She also admitted that she had stopped taking her medications for heart failure as well as diabetes and hypertension.  She apparently wrote a note stating she would kill her self, and that she is frustrated and fatigued by all the complications in her life.  She works 6 days a week, and she does not believe that her adult children respect her.  She gets angry with her children for not helping her.  She also admitted to unresolved grief issues about the death of her paternal grandmother.  Her answer about whether or not the Celexa was helping in the past was "at least I was not crying, and I was not as upset as I was".  She denied any previous suicide attempts.  She denied any previous psychiatric admissions.  She is requesting discharge upon admission.  She was admitted to the hospital for evaluation and  stabilization.  Continued Clinical Symptoms:  Alcohol Use Disorder Identification Test Final Score (AUDIT): 0 The "Alcohol Use Disorders Identification Test", Guidelines for Use in Primary Care, Second Edition.  World Science writer [facility]). Score between 0-7:  no or low risk or alcohol related problems. Score between 8-15:  moderate risk of alcohol related problems. Score between 16-19:  high risk of alcohol related problems. Score 20 or above:  warrants further diagnostic evaluation for alcohol dependence and treatment.   CLINICAL FACTORS:   Severe Anxiety and/or Agitation Depression:   Anhedonia Hopelessness Impulsivity Insomnia   Musculoskeletal: Strength & Muscle Tone: within normal limits Gait & Station: normal Patient leans: N/A  Psychiatric Specialty Exam: Physical Exam  Nursing note and vitals reviewed. Constitutional: She is oriented to person, place, and time. She appears well-developed and well-nourished.  HENT:  Head: Normocephalic and atraumatic.  Respiratory: Effort normal.  Neurological: She is alert and oriented to person, place, and time.    ROS  Blood pressure (!) 143/94, pulse 95, temperature 98.9 F (37.2 C), temperature source Oral, resp. rate 18, height 5\' 6"  (1.676 m), weight 108.9 kg, SpO2 99 %.Body mass index is 38.74 kg/m.  General Appearance: Disheveled  Eye Contact:  Fair  Speech:  Normal Rate  Volume:  Normal  Mood:  Depressed  Affect:  Congruent  Thought Process:  Coherent and Descriptions of Associations: Intact  Orientation:  Full (Time, Place, and Person)  Thought Content:  Logical  Suicidal Thoughts:  Yes.  without intent/plan  Homicidal Thoughts:  No  Memory:  Immediate;   Fair Recent;  Fair Remote;   Fair  Judgement:  Intact  Insight:  Fair  Psychomotor Activity:  Normal  Concentration:  Concentration: Fair and Attention Span: Fair  Recall:  Good  Fund of Knowledge:  Good  Language:  Good  Akathisia:  Negative   Handed:  Right  AIMS (if indicated):     Assets:  Desire for Improvement Housing Resilience Talents/Skills  ADL's:  Intact  Cognition:  WNL  Sleep:  Number of Hours: 6.75      COGNITIVE FEATURES THAT CONTRIBUTE TO RISK:  None    SUICIDE RISK:   Minimal: No identifiable suicidal ideation.  Patients presenting with no risk factors but with morbid ruminations; may be classified as minimal risk based on the severity of the depressive symptoms  PLAN OF CARE: Patient is seen and examined.  Patient is a 53 year old female with the above-stated past psychiatric history who [facility] department on 01/30/2019 with suicidal ideation.  She is already requesting discharge.  She denies suicidal ideation.  She was placed here under involuntary commitment, but from review of the electronic medical record I think a lot of that had to do with her irritability over the fact that they would not give her her purse.  She has no previous history of suicide attempts, no previous history of any admissions.  I have discussed with her at least getting the medications back in her system, and she is agreeable to that.  She also realizes that the Celexa was effective in treating her depression because she was not having crying spells or any of these thoughts prior to stopping her medications.  Her blood pressure was significantly elevated in the emergency department, and that has come down the day with the restart of her medications.  She also has a history of congestive heart failure, sleep apnea (noncompliant with CPAP), peripheral vascular disease, hypertension as well as hyperlipidemia and diabetes mellitus type 2.  All these medications have been restarted.  Review of her laboratories revealed a mildly low potassium at 3.3, her blood sugar was elevated at 161, a negative drug screen, and negative blood alcohol.  Hopefully we can get her back on her medications for her  psychiatric issues as well as her medical issues and get her turned around rather quickly.  She does complain significantly of insomnia, and it appears that she received trazodone last night was successful in treating her insomnia.  We also discussed the need to get back on her CPAP for medical as well as psychiatric reasons.  I certify that inpatient services furnished can reasonably be expected to improve the patient's condition.   Antonieta PertGreg Lawson Clary, MD 02/01/2019, 9:01 AM

## 2019-02-01 NOTE — H&P (Signed)
Psychiatric Admission Assessment Adult  Patient Identification: Lori Carter MRN:  [id number] Date of Evaluation:  02/01/2019 Chief Complaint:  MDD SINGLE EPISODE; SEVERE Principal Diagnosis: <principal problem not specified> Diagnosis:  Active Problems:   MDD (major depressive disorder)  History of Present Illness: Patient is seen and examined.  Patient is a 53 year old female with a past psychiatric history significant for major depression who presented to the [facility] emergency department on 01/30/2019 with concern for suicidal ideation.  The patient stated at that time that she has a history of depression and anxiety, but had stopped taking her medicines over 30 days ago.  She had previously been treated with Celexa.  She stated that over the last 30 days she had began to have crying spells, feeling helpless, hopeless and worthless.  She is began to think about harming herself.  She also admitted that she had stopped taking her medications for heart failure as well as diabetes and hypertension.  She apparently wrote a note stating she would kill her self, and that she is frustrated and fatigued by all the complications in her life.  She works 6 days a week, and she does not believe that her adult children respect her.  She gets angry with her children for not helping her.  She also admitted to unresolved grief issues about the death of her paternal grandmother.  Her answer about whether or not the Celexa was helping in the past was "at least I was not crying, and I was not as upset as I was".  She denied any previous suicide attempts.  She denied any previous psychiatric admissions.  She is requesting discharge upon admission.  She was admitted to the hospital for evaluation and stabilization.  Associated Signs/Symptoms: Depression Symptoms:  depressed mood, anhedonia, insomnia, psychomotor agitation, fatigue, feelings of worthlessness/guilt, difficulty  concentrating, hopelessness, suicidal thoughts without plan, anxiety, loss of energy/fatigue, disturbed sleep, (Hypo) Manic Symptoms:  Impulsivity, Irritable Mood, Anxiety Symptoms:  Excessive Worry, Psychotic Symptoms:  denied PTSD Symptoms: Negative Total Time spent with patient: 30 minutes  Past Psychiatric History: Patient has been treated by her primary care provider for depression.  She has previously been successfully treated with Celexa.  No previous psychiatric admissions, no previous suicide attempts, no formal psychiatric evaluations in the past.  Is the patient at risk to self? No.  Has the patient been a risk to self in the past 6 months? No.  Has the patient been a risk to self within the distant past? No.  Is the patient a risk to others? No.  Has the patient been a risk to others in the past 6 months? No.  Has the patient been a risk to others within the distant past? No.   Prior Inpatient Therapy:   Prior Outpatient Therapy:    Alcohol Screening: Patient refused Alcohol Screening Tool: Yes 1. How often do you have a drink containing alcohol?: Never 2. How many drinks containing alcohol do you have on a typical day when you are drinking?: 1 or 2 3. How often do you have six or more drinks on one occasion?: Never AUDIT-C Score: 0 4. How often during the last year have you found that you were not able to stop drinking once you had started?: Never 5. How often during the last year have you failed to do what was normally expected from you becasue of drinking?: Never 6. How often during the last year have you needed a first drink in the  morning to get yourself going after a heavy drinking session?: Never 7. How often during the last year have you had a feeling of guilt of remorse after drinking?: Never 8. How often during the last year have you been unable to remember what happened the night before because you had been drinking?: Never 9. Have you or someone else been  injured as a result of your drinking?: No 10. Has a relative or friend or a doctor or another health worker been concerned about your drinking or suggested you cut down?: No Alcohol Use Disorder Identification Test Final Score (AUDIT): 0 Alcohol Brief Interventions/Follow-up: Patient Refused Substance Abuse History in the last 12 months:  No. Consequences of Substance Abuse: Negative Previous Psychotropic Medications: No  Psychological Evaluations: No  Past Medical History:  Past Medical History:  Diagnosis Date  . Acne   . Anemia 02/17/2018  . Anxiety   . Aortic occlusion (HCC) 02/08/2017  . Atopic neurodermatitis   . Cardiomegaly   . Cardiomyopathy (HCC) 04/23/2016  . Cellulitis of toe of left foot    acute  . Complication of anesthesia    woke up during surgery one  20 some years ago  . COPD (chronic obstructive pulmonary disease) (HCC)   . Diabetic neuropathy (HCC)    fingers  . Dyspnea    with exertion  . Dysthymic disorder   . Epidermal inclusion cyst   . Essential hypertension 04/23/2016  . Gastric mass   . Gastric ulcer 02/17/2018  . GERD (gastroesophageal reflux disease)    not taking anything  . Heart murmur    told years ago- , no one has said anything in years  . Hyperlipidemia   . Hypertension, benign    chronic  . Obesity 04/23/2016  . Obstructive sleep apnea    uses cpap set on 13 uses 1/2 of the tiem   . OSA (obstructive sleep apnea) 02/17/2018  . Tobacco abuse 04/23/2016  . Type 2 diabetes mellitus without complication (HCC)    chronic    Past Surgical History:  Procedure Laterality Date  . 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 EUS N/A 09/09/2017   Procedure: UPPER ENDOSCOPIC ULTRASOUND (EUS) LINEAR;  Surgeon: Rachael FeeJacobs, Daniel P, MD;  Location: [facility] ENDOSCOPY;  Service: Endoscopy;  Laterality: N/A;  . none     . NOVASURE ABLATION    . SHOULDER SURGERY Left    rotator cuff  . TUBAL LIGATION     Family History:  Family History  Problem Relation Age of Onset  . Hypertension Mother   . Clotting disorder Mother   . Heart murmur Father   . Pancreatic cancer Father   . Diabetes Father   . Hypertension Father    Family Psychiatric  History: Noncontributory Tobacco Screening: Have you used any form of tobacco in the last 30 days? (Cigarettes, Smokeless Tobacco, Cigars, and/or Pipes): Yes Tobacco use, Select all that apply: 5 or more cigarettes per day Are you interested in Tobacco Cessation Medications?: Yes, will notify MD for an order Counseled patient on smoking cessation including recognizing danger situations, developing coping skills and basic information about quitting provided: Refused/Declined practical counseling Social History:  Social History   Substance and Sexual Activity  Alcohol Use No     Social History   Substance and Sexual Activity  Drug Use No  Additional Social History:                           Allergies:  No Known Allergies Lab Results:  Results for orders placed or performed during the hospital encounter of 01/31/19 (from the past 48 hour(s))  Hemoglobin A1c     Status: Abnormal   Collection Time: 02/01/19  6:42 AM  Result Value Ref Range   Hgb A1c MFr Bld 6.8 (H) 4.8 - 5.6 %    Comment: (NOTE) Pre diabetes:          5.7%-6.4% Diabetes:              >6.4% Glycemic control for   <7.0% adults with diabetes    Mean Plasma Glucose 148.46 mg/dL    Comment: Performed at [facility], [address], [place], [place] [address]  Lipid panel     Status: Abnormal   Collection Time: 02/01/19  6:42 AM  Result Value Ref Range   Cholesterol 162 0 - 200 mg/dL   Triglycerides 604 <540 mg/dL   HDL 32 (L) >98 mg/dL   Total CHOL/HDL Ratio 5.1 RATIO   VLDL 25 0 - 40 mg/dL   LDL Cholesterol 119 (H) 0 - 99 mg/dL    Comment:        Total  Cholesterol/HDL:CHD Risk Coronary Heart Disease Risk Table                     Men   Women  1/2 Average Risk   3.4   3.3  Average Risk       5.0   4.4  2 X Average Risk   9.6   7.1  3 X Average Risk  23.4   11.0        Use the calculated Patient Ratio above and the CHD Risk Table to determine the patient's CHD Risk.        ATP III CLASSIFICATION (LDL):  <100     mg/dL   Optimal  147-829  mg/dL   Near or Above                    Optimal  130-159  mg/dL   Borderline  562-130  mg/dL   High  >865     mg/dL   Very High Performed at [facility], [address], [place], [place] [address]   TSH     Status: None   Collection Time: 02/01/19  6:42 AM  Result Value Ref Range   TSH 1.051 0.350 - 4.500 uIU/mL    Comment: Performed by a 3rd Generation assay with a functional sensitivity of <=0.01 uIU/mL. Performed at [facility], [address] [place], [place] [address]   Glucose, capillary     Status: Abnormal   Collection Time: 02/01/19  6:52 AM  Result Value Ref Range   Glucose-Capillary 124 (H) 70 - 99 mg/dL    Blood Alcohol level:  Lab Results  Component Value Date   ETH <10 01/30/2019    Metabolic Disorder Labs:  Lab Results  Component Value Date   HGBA1C 6.8 (H) 02/01/2019   MPG 148.46 02/01/2019   MPG 148 02/08/2017   No results found for: PROLACTIN Lab Results  Component Value Date   CHOL 162 02/01/2019   TRIG 124 02/01/2019   HDL 32 (L) 02/01/2019   CHOLHDL 5.1 02/01/2019   VLDL 25 02/01/2019  LDLCALC 105 (H) 02/01/2019    Current Medications: Current Facility-Administered Medications  Medication Dose Route Frequency Provider Last Rate Last Dose  . acetaminophen (TYLENOL) tablet 650 mg  650 mg Oral Q6H PRN Denzil Magnuson, NP      . albuterol (VENTOLIN HFA) 108 (90 Base) MCG/ACT inhaler 2 puff  2 puff Inhalation Q6H PRN Nira Conn A, NP      . alum & mag hydroxide-simeth (MAALOX/MYLANTA) 200-200-20 MG/5ML  suspension 30 mL  30 mL Oral Q4H PRN Nira Conn A, NP      . aspirin EC tablet 81 mg  81 mg Oral BID Nira Conn A, NP   81 mg at 02/01/19 0808  . atorvastatin (LIPITOR) tablet 10 mg  10 mg Oral Daily Nira Conn A, NP   10 mg at 02/01/19 0809  . carvedilol (COREG) tablet 25 mg  25 mg Oral BID WC Nira Conn A, NP   25 mg at 02/01/19 0808  . citalopram (CELEXA) tablet 20 mg  20 mg Oral Daily Nira Conn A, NP   20 mg at 02/01/19 1610  . hydrOXYzine (ATARAX/VISTARIL) tablet 25 mg  25 mg Oral TID PRN Nira Conn A, NP      . irbesartan (AVAPRO) tablet 75 mg  75 mg Oral Daily Nira Conn A, NP   75 mg at 02/01/19 0809  . magnesium hydroxide (MILK OF MAGNESIA) suspension 30 mL  30 mL Oral Daily PRN Nira Conn A, NP      . metFORMIN (GLUCOPHAGE) tablet 500 mg  500 mg Oral BID WC Nira Conn A, NP   500 mg at 02/01/19 0808  . nicotine (NICODERM CQ - dosed in mg/24 hours) patch 21 mg  21 mg Transdermal Daily Nira Conn A, NP   21 mg at 02/01/19 9604  . pantoprazole (PROTONIX) EC tablet 40 mg  40 mg Oral Daily Nira Conn A, NP   40 mg at 02/01/19 5409  . potassium chloride SA (K-DUR) CR tablet 20 mEq  20 mEq Oral Daily Antonieta Pert, MD      . torsemide [facility]) tablet 10 mg  10 mg Oral Daily Nira Conn A, NP   10 mg at 02/01/19 8119  . traZODone (DESYREL) tablet 50 mg  50 mg Oral QHS PRN Jackelyn Poling, NP       PTA Medications: Medications Prior to Admission  Medication Sig Dispense Refill Last Dose  . albuterol (PROVENTIL HFA;VENTOLIN HFA) 108 (90 Base) MCG/ACT inhaler Inhale 2 puffs into the lungs every 6 (six) hours as needed for wheezing or shortness of breath.   Past Week at Unknown time  . aspirin EC 81 MG EC tablet Take 1 tablet (81 mg total) by mouth 2 (two) times daily. 30 tablet 3 Past Month at Unknown time  . atorvastatin (LIPITOR) 10 MG tablet Take 10 mg by mouth daily.   Past Month at Unknown time  . carvedilol (COREG) 25 MG tablet Take 25 mg by mouth 2 (two) times  daily with a meal.   Past Month at Unknown time  . citalopram (CELEXA) 20 MG tablet Take 20 mg by mouth daily.   Past Month at Unknown time  . metFORMIN (GLUCOPHAGE) 500 MG tablet Take 500 mg by mouth 2 (two) times daily with a meal.   Past Month at Unknown time  . omeprazole (PRILOSEC) 20 MG capsule Take 20 mg by mouth daily.   Past Month at Unknown time  . potassium chloride (MICRO-K) 10 MEQ CR  capsule Take 1 capsule by mouth daily.  2 Past Month at Unknown time  . telmisartan (MICARDIS) 40 MG tablet Take 1 tablet (40 mg total) by mouth daily. 90 tablet 1 Past Month at Unknown time  . torsemide (DEMADEX) 20 MG tablet Take 1 tablet (20 mg) twice daily. Take an extra tablet (20 mg) in the morning on M,W,F. 270 tablet 1 Past Month at Unknown time    Musculoskeletal: Strength & Muscle Tone: within normal limits Gait & Station: normal Patient leans: N/A  Psychiatric Specialty Exam: Physical Exam  Nursing note and vitals reviewed. Constitutional: She is oriented to person, place, and time. She appears well-developed and well-nourished.  HENT:  Head: Normocephalic and atraumatic.  Respiratory: Effort normal.  Neurological: She is alert and oriented to person, place, and time.    ROS  Blood pressure (!) 143/94, pulse 95, temperature 98.9 F (37.2 C), temperature source Oral, resp. rate 18, height 5\' 6"  (1.676 m), weight 108.9 kg, SpO2 99 %.Body mass index is 38.74 kg/m.  General Appearance: Disheveled  Eye Contact:  Fair  Speech:  Normal Rate  Volume:  Normal  Mood:  Anxious  Affect:  Congruent  Thought Process:  Coherent and Descriptions of Associations: Intact  Orientation:  Full (Time, Place, and Person)  Thought Content:  Logical  Suicidal Thoughts:  No  Homicidal Thoughts:  No  Memory:  Immediate;   Fair Recent;   Fair Remote;   Fair  Judgement:  Intact  Insight:  Fair  Psychomotor Activity:  Increased  Concentration:  Concentration: Fair and Attention Span: Fair  Recall:   [employer] of Knowledge:  Fair  Language:  Good  Akathisia:  Negative  Handed:  Right  AIMS (if indicated):     Assets:  Desire for Improvement Resilience  ADL's:  Intact  Cognition:  WNL  Sleep:  Number of Hours: 6.75    Treatment Plan Summary: Daily contact with patient to assess and evaluate symptoms and progress in treatment, Medication management and Plan : tient is seen and examined.  Patient is a 53 year old female with the above-stated past psychiatric history who [facility] on 01/30/2019 with suicidal ideation.  She is already requesting discharge.  She denies suicidal ideation.  She was placed here under involuntary commitment, but from review of the electronic medical record I think a lot of that had to do with her irritability over the fact that they would not give her her purse.  She has no previous history of suicide attempts, no previous history of any admissions.  I have discussed with her at least getting the medications back in her system, and she is agreeable to that.  She also realizes that the Celexa was effective in treating her depression because she was not having crying spells or any of these thoughts prior to stopping her medications.  Her blood pressure was significantly elevated in the emergency department, and that has come down the day with the restart of her medications.  She also has a history of congestive heart failure, sleep apnea (noncompliant with CPAP), peripheral vascular disease, hypertension as well as hyperlipidemia and diabetes mellitus type 2.  All these medications have been restarted.  Review of her laboratories revealed a mildly low potassium at 3.3, her blood sugar was elevated at 161, a negative drug screen, and negative blood alcohol.  Hopefully we can get her back on her medications for her psychiatric issues as well as her  medical issues and get her turned around rather quickly.  She does complain  significantly of insomnia, and it appears that she received trazodone last night was successful in treating her insomnia.  We also discussed the need to get back on her CPAP for medical as well as psychiatric reasons.  Observation Level/Precautions:  15 minute checks  Laboratory:  Chemistry Profile  Psychotherapy:    Medications:    Consultations:    Discharge Concerns:    Estimated LOS:  Other:     Physician Treatment Plan for Primary Diagnosis: <principal problem not specified> Long Term Goal(s): Improvement in symptoms so as ready for discharge  Short Term Goals: Ability to identify changes in lifestyle to reduce recurrence of condition will improve, Ability to verbalize feelings will improve, Ability to disclose and discuss suicidal ideas, Ability to demonstrate self-control will improve, Ability to identify and develop effective coping behaviors will improve, Ability to maintain clinical measurements within normal limits will improve and Compliance with prescribed medications will improve  Physician Treatment Plan for Secondary Diagnosis: Active Problems:   MDD (major depressive disorder)  Long Term Goal(s): Improvement in symptoms so as ready for discharge  Short Term Goals: Ability to identify changes in lifestyle to reduce recurrence of condition will improve, Ability to verbalize feelings will improve, Ability to disclose and discuss suicidal ideas, Ability to demonstrate self-control will improve, Ability to identify and develop effective coping behaviors will improve, Ability to maintain clinical measurements within normal limits will improve and Compliance with prescribed medications will improve  I certify that inpatient services furnished can reasonably be expected to improve the patient's condition.    Antonieta Pert, MD 6/3/202010:28 AM

## 2019-02-01 NOTE — Progress Notes (Signed)
Patient ID: Lori Carter, female   DOB: Jan 06, 1966, 53 y.o.   MRN: [id number]  [place] NOVEL CORONAVIRUS (COVID-19) DAILY CHECK-OFF SYMPTOMS - answer yes or no to each - every day NO YES  Have you had a fever in the past 24 hours?  . Fever (Temp > 37.80C / 100F) X   Have you had any of these symptoms in the past 24 hours? . New Cough .  Sore Throat  .  Shortness of Breath .  Difficulty Breathing .  Unexplained Body Aches   X   Have you had any one of these symptoms in the past 24 hours not related to allergies?   . Runny Nose .  Nasal Congestion .  Sneezing   X   If you have had runny nose, nasal congestion, sneezing in the past 24 hours, has it worsened?  X   EXPOSURES - check yes or no X   Have you traveled outside the state in the past 14 days?  X   Have you been in contact with someone with a confirmed diagnosis of COVID-19 or PUI in the past 14 days without wearing appropriate PPE?  X   Have you been living in the same home as a person with confirmed diagnosis of COVID-19 or a PUI (household contact)?    X   Have you been diagnosed with COVID-19?    X              What to do next: Answered NO to all: Answered YES to anything:   Proceed with unit schedule Follow the [facility] Inpatient Flowsheet.

## 2019-02-01 NOTE — [facility] Suicide Risk Assessment (Signed)
[facility] INPATIENT:  Family/Significant Other Suicide Prevention Education  Suicide Prevention Education:  Patient Refusal for Family/Significant Other Suicide Prevention Education: The patient Lori Carter has refused to provide written consent for family/significant other to be provided Family/Significant Other Suicide Prevention Education during admission and/or prior to discharge.  Physician notified.  SPE completed with patient, as patient refused to consent to family contact. SPI pamphlet provided to pt and pt was encouraged to share information with support network, ask questions, and talk about any concerns relating to SPE. Patient denies access to guns/firearms and verbalized understanding of information provided. Mobile Crisis information also provided to patient.    [name] 02/01/2019, 2:05 PM

## 2019-02-02 LAB — BASIC METABOLIC PANEL
Anion gap: 9 (ref 5–15)
BUN: 21 mg/dL — ABNORMAL HIGH (ref 6–20)
CO2: 27 mmol/L (ref 22–32)
Calcium: 9.1 mg/dL (ref 8.9–10.3)
Chloride: 105 mmol/L (ref 98–111)
Creatinine, Ser: 0.86 mg/dL (ref 0.44–1.00)
GFR calc Af Amer: >60 mL/min (ref >60)
GFR calc non Af Amer: >60 mL/min (ref >60)
Glucose, Bld: 114 mg/dL — ABNORMAL HIGH (ref 70–99)
Potassium: 3.8 mmol/L (ref 3.5–5.1)
Sodium: 141 mmol/L (ref 135–145)

## 2019-02-02 LAB — GLUCOSE, CAPILLARY: Glucose-Capillary: 113 mg/dL — ABNORMAL HIGH (ref 70–99)

## 2019-02-02 MED ORDER — NICOTINE 21 MG/24HR TD PT24: 21.0000 mg | MEDICATED_PATCH | Freq: Every day | TRANSDERMAL | 0 refills | Status: AC

## 2019-02-02 MED ORDER — CITALOPRAM HYDROBROMIDE 20 MG PO TABS: 20.0000 mg | ORAL_TABLET | Freq: Every day | ORAL | 0 refills | Status: AC

## 2019-02-02 MED ORDER — POTASSIUM CHLORIDE 20 MEQ/15ML (10%) PO SOLN
10.0000 meq | Freq: Every day | ORAL | Status: DC
  Administered 2019-02-02: 10 meq via ORAL
  Filled 2019-02-02 (×3): qty 7.5

## 2019-02-02 NOTE — Progress Notes (Signed)
  [facility] Adult Case Management Discharge Plan :  Will you be returning to the same living situation after discharge:  Yes,  home At discharge, do you have transportation home?: Yes,  friend will pick her up Do you have the ability to pay for your medications: Yes,  BCBS insurance  Release of information consent forms completed and in the chart;  Work Physicist, medical on chart. Patient to Follow up at: Follow-up Information    patient declines Follow up.   Contact information: patient declines outpatient follow up           Next level of care provider has access to [facility] Link:no  Safety Planning and Suicide Prevention discussed: No. Declines consents, SPE pamphlet placed on chart for patient to share with supports.  Have you used any form of tobacco in the last 30 days? (Cigarettes, Smokeless Tobacco, Cigars, and/or Pipes): Yes  Has patient been referred to the Quitline?: Patient refused referral  Patient has been referred for addiction treatment: Pt. refused referral  Darreld Mclean, LCSWA 02/02/2019, 11:53 AM

## 2019-02-02 NOTE — Plan of Care (Signed)
  Problem: Safety: Goal: Periods of time without injury will increase Outcome: Progressing   

## 2019-02-02 NOTE — Progress Notes (Signed)
DAR NOTE: Pt present with flat affect and depressed mood in the unit. Pt has been isolating herself and has been bed the whole evening. Pt denies physical pain. Pt's safety ensured with 15 minute and environmental checks. Pt currently denies SI/HI and A/V hallucinations. Pt verbally agrees to seek staff if SI/HI or A/VH occurs and to consult with staff before acting on these thoughts. Will continue POC.

## 2019-02-02 NOTE — [facility] Suicide Risk Assessment (Signed)
[facility] Discharge Suicide Risk Assessment   Principal Problem: <principal problem not specified> Discharge Diagnoses: Active Problems:   MDD (major depressive disorder)   Total Time spent with patient: 30 minutes  Musculoskeletal: Strength & Muscle Tone: within normal limits Gait & Station: normal Patient leans: N/A  Psychiatric Specialty Exam: Review of Systems  All other systems reviewed and are negative.   Blood pressure (!) 150/92, pulse 97, temperature 98.9 F (37.2 C), temperature source Oral, resp. rate 16, height 5\' 6"  (1.676 m), weight 108.9 kg, SpO2 99 %.Body mass index is 38.74 kg/m.  General Appearance: Casual  Eye Contact::  Fair  Speech:  Normal Rate409  Volume:  Normal  Mood:  Anxious  Affect:  Congruent  Thought Process:  Coherent and Descriptions of Associations: Intact  Orientation:  Full (Time, Place, and Person)  Thought Content:  Logical  Suicidal Thoughts:  No  Homicidal Thoughts:  No  Memory:  Immediate;   Fair Recent;   Fair Remote;   Fair  Judgement:  Intact  Insight:  Fair  Psychomotor Activity:  Normal  Concentration:  Good  Recall:  Good  Fund of Knowledge:Good  Language: Good  Akathisia:  Negative  Handed:  Right  AIMS (if indicated):     Assets:  Desire for Improvement Resilience  Sleep:  Number of Hours: 5.5  Cognition: WNL  ADL's:  Intact   Mental Status Per Nursing Assessment::   On Admission:  Self-harm thoughts  Demographic Factors:  Divorced or widowed and Low socioeconomic status  Loss Factors: Financial problems/change in socioeconomic status  Historical Factors: Impulsivity  Risk Reduction Factors:   Employed and Living with another person, especially a relative  Continued Clinical Symptoms:  Depression:   Impulsivity  Cognitive Features That Contribute To Risk:  None    Suicide Risk:  Minimal: No identifiable suicidal ideation.  Patients presenting with no risk factors but with morbid ruminations; may be  classified as minimal risk based on the severity of the depressive symptoms  Follow-up Information    patient declines Follow up.   Contact information: patient declines outpatient follow up           Plan Of Care/Follow-up recommendations:  Activity:  ad lib  Antonieta Pert, MD 02/02/2019, 2:25 PM

## 2019-02-02 NOTE — Progress Notes (Signed)
D:  Patient's self inventory sheet, patient sleeps good, no sleep medication given.  Fair appetite, normal energy level, good concentration.  Rated depression, hopeless and anxiety 10.  Denied withdrawals.  Denied SI.  Denied physical problems.  Denied physical pain.  Goal is "my pocketbook".  "I am ready to go home with my pocketbook." A:  Medications administered per MD orders.  Emotional support and encouragement given patient. R:  Denied SI and HI, contracts for safety.  Denied A/V hallucnations.  Safety maintained with 15 minute checks.

## 2019-02-02 NOTE — Discharge Summary (Signed)
Physician Discharge Summary Note  Patient:  Lori Carter is an 53 y.o., female MRN:  [id number] DOB:  August 01, 1966 Patient phone:  [phone] (home)  Patient address:   [address] [place] [address],  Total Time spent with patient: 15 minutes  Date of Admission:  01/31/2019 Date of Discharge: 02/02/19  Reason for Admission:  suicidal ideation  Principal Problem: <principal problem not specified> Discharge Diagnoses: Active Problems:   MDD (major depressive disorder)   Past Psychiatric History: Per admission H&P: Patient has been treated by her primary care provider for depression.  She has previously been successfully treated with Celexa.  No previous psychiatric admissions, no previous suicide attempts, no formal psychiatric evaluations in the past.  Past Medical History:  Past Medical History:  Diagnosis Date  . Acne   . Anemia 02/17/2018  . Anxiety   . Aortic occlusion (HCC) 02/08/2017  . Atopic neurodermatitis   . Cardiomegaly   . Cardiomyopathy (HCC) 04/23/2016  . Cellulitis of toe of left foot    acute  . Complication of anesthesia    woke up during surgery one  20 some years ago  . COPD (chronic obstructive pulmonary disease) (HCC)   . Diabetic neuropathy (HCC)    fingers  . Dyspnea    with exertion  . Dysthymic disorder   . Epidermal inclusion cyst   . Essential hypertension 04/23/2016  . Gastric mass   . Gastric ulcer 02/17/2018  . GERD (gastroesophageal reflux disease)    not taking anything  . Heart murmur    told years ago- , no one has said anything in years  . Hyperlipidemia   . Hypertension, benign    chronic  . Obesity 04/23/2016  . Obstructive sleep apnea    uses cpap set on 13 uses 1/2 of the tiem   . OSA (obstructive sleep apnea) 02/17/2018  . Tobacco abuse 04/23/2016  . Type 2 diabetes mellitus without complication (HCC)    chronic    Past Surgical History:  Procedure Laterality Date  . 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 EUS N/A 09/09/2017   Procedure: UPPER ENDOSCOPIC ULTRASOUND (EUS) LINEAR;  Surgeon: Rachael Fee, MD;  Location: [facility] ENDOSCOPY;  Service: Endoscopy;  Laterality: N/A;  . none    . NOVASURE ABLATION    . SHOULDER SURGERY Left    rotator cuff  . TUBAL LIGATION     Family History:  Family History  Problem Relation Age of Onset  . Hypertension Mother   . Clotting disorder Mother   . Heart murmur Father   . Pancreatic cancer Father   . Diabetes Father   . Hypertension Father    Family Psychiatric  History: Denies Social History:  Social History   Substance and Sexual Activity  Alcohol Use No     Social History   Substance and Sexual Activity  Drug Use No    Social History   Socioeconomic History  . Marital status: Divorced    Spouse name: Not on file  . Number of children: Not on file  . Years of education: Not on file  . Highest education level: Not on file  Occupational History  . Occupation: CNA  Social Needs  . Financial resource strain: Not on file  . Food insecurity:    Worry: Not on file  Inability: Not on file  . Transportation needs:    Medical: Not on file    Non-medical: Not on file  Tobacco Use  . Smoking status: Light Tobacco Smoker    Packs/day: 0.33    Years: 29.00    Pack years: 9.57  . Smokeless tobacco: Never Used  . Tobacco comment: 3 cigarettes per day  Substance and Sexual Activity  . Alcohol use: No  . Drug use: No  . Sexual activity: Not Currently  Lifestyle  . Physical activity:    Days per week: Not on file    Minutes per session: Not on file  . Stress: Not on file  Relationships  . Social connections:    Talks on phone: Not on file    Gets together: Not on file    Attends religious service: Not on file    Active member of club or organization: Not on file    Attends meetings of  clubs or organizations: Not on file    Relationship status: Not on file  Other Topics Concern  . Not on file  Social History Narrative  . Not on file    Hospital Course:  From admission H&P: Patient is a 53 year old female with a past psychiatric history significant for major depression who presented to the [facility] emergency department on 01/30/2019 with concern for suicidal ideation. The patient stated at that time that she has a history of depression and anxiety, but had stopped taking her medicines over 30 days ago. She had previously been treated with Celexa. She stated that over the last 30 days she had began to have crying spells, feeling helpless, hopeless and worthless. She is began to think about harming herself. She also admitted that she had stopped taking her medications for heart failure as well as diabetes and hypertension. She apparently wrote a note stating she would kill her self, and that she is frustrated and fatigued by all the complications in her life. She works 6 days a week, and she does not believe that her adult children respect her. She gets angry with her children for not helping her. She also admitted to unresolved grief issues about the death of her paternal grandmother. Her answer about whether or not the Celexa was helping in the past was "at least I was not crying, and I was not as upset as I was". She denied any previous suicide attempts. She denied any previous psychiatric admissions. She is requesting discharge upon admission. She was admitted to the hospital for evaluation and stabilization.  Ms. Ruzich was admitted for depression. She was restarted on Celexa, as well as her home medications for diabetes and hypertension. Patient's belongings had been mistakenly taken from the ED by [facility] while transporting another patient. [facility] mailed the belongings to [facility] [place], and patient received her belongings on day of  discharge. She remained on the [facility] unit for 2 days. She stabilized with medication and therapy. She was discharged on the medications listed below. She has shown improvement with improved mood, affect, sleep, appetite, and interaction. She denies any SI/HI/AVH and contracts for safety. She declines referrals for follow-up. Patient is provided with prescriptions for medications upon discharge. Her friend is picking her up for discharge home.  Physical Findings: AIMS: Facial and Oral Movements Muscles of Facial Expression: None, normal Lips and Perioral Area: None, normal Jaw: None, normal Tongue: None, normal,Extremity Movements Upper (arms, wrists, hands, fingers): None, normal Lower (legs, knees, ankles,  toes): None, normal, Trunk Movements Neck, shoulders, hips: None, normal, Overall Severity Severity of abnormal movements (highest score from questions above): None, normal Incapacitation due to abnormal movements: None, normal Patient's awareness of abnormal movements (rate only patient's report): No Awareness, Dental Status Current problems with teeth and/or dentures?: No Does patient usually wear dentures?: No  CIWA:  CIWA-Ar Total: 1 COWS:  COWS Total Score: 2  Musculoskeletal: Strength & Muscle Tone: within normal limits Gait & Station: normal Patient leans: N/A  Psychiatric Specialty Exam: Physical Exam  Nursing note and vitals reviewed. Constitutional: She is oriented to person, place, and time. She appears well-developed and well-nourished.  Cardiovascular: Normal rate.  Respiratory: Effort normal.  Neurological: She is alert and oriented to person, place, and time.    Review of Systems  Constitutional: Negative.   Respiratory: Negative for cough and shortness of breath.   Cardiovascular: Negative for chest pain.  Gastrointestinal: Negative for diarrhea, nausea and vomiting.  Neurological: Negative for headaches.  Psychiatric/Behavioral: Positive for depression (stable  on medication). Negative for hallucinations, substance abuse and suicidal ideas. The patient is not nervous/anxious and does not have insomnia.     Blood pressure (!) 150/92, pulse 97, temperature 98.9 F (37.2 C), temperature source Oral, resp. rate 16, height 5\' 6"  (1.676 m), weight 108.9 kg, SpO2 99 %.Body mass index is 38.74 kg/m.  See MD's discharge SRA     Have you used any form of tobacco in the last 30 days? (Cigarettes, Smokeless Tobacco, Cigars, and/or Pipes): Yes  Has this patient used any form of tobacco in the last 30 days? (Cigarettes, Smokeless Tobacco, Cigars, and/or Pipes) Yes, a prescription for an FDA-approved medication for tobacco cessation was offered at discharge.   Blood Alcohol level:  Lab Results  Component Value Date   ETH <10 01/30/2019    Metabolic Disorder Labs:  Lab Results  Component Value Date   HGBA1C 6.8 (H) 02/01/2019   MPG 148.46 02/01/2019   MPG 148 02/08/2017   No results found for: PROLACTIN Lab Results  Component Value Date   CHOL 162 02/01/2019   TRIG 124 02/01/2019   HDL 32 (L) 02/01/2019   CHOLHDL 5.1 02/01/2019   VLDL 25 02/01/2019   LDLCALC 105 (H) 02/01/2019    See Psychiatric Specialty Exam and Suicide Risk Assessment completed by Attending Physician prior to discharge.  Discharge destination:  Home  Is patient on multiple antipsychotic therapies at discharge:  No   Has Patient had three or more failed trials of antipsychotic monotherapy by history:  No  Recommended Plan for Multiple Antipsychotic Therapies: NA  Discharge Instructions    Discharge instructions   Complete by:  As directed    Patient is instructed to take all prescribed medications as recommended. Report any side effects or adverse reactions to your outpatient psychiatrist. Patient is instructed to abstain from alcohol and illegal drugs while on prescription medications. In the event of worsening symptoms, patient is instructed to call the crisis  hotline, 911, or go to the nearest emergency department for evaluation and treatment.     Allergies as of 02/02/2019   No Known Allergies     Medication List    TAKE these medications     Indication  albuterol 108 (90 Base) MCG/ACT inhaler Commonly known as:  VENTOLIN HFA Inhale 2 puffs into the lungs every 6 (six) hours as needed for wheezing or shortness of breath.  Indication:  Asthma   aspirin 81 MG EC tablet Take 1 tablet (  81 mg total) by mouth 2 (two) times daily.  Indication:  Antiplatelet   atorvastatin 10 MG tablet Commonly known as:  LIPITOR Take 10 mg by mouth daily.  Indication:  High Amount of Fats in the Blood   carvedilol 25 MG tablet Commonly known as:  COREG Take 25 mg by mouth 2 (two) times daily with a meal.  Indication:  High Blood Pressure Disorder   citalopram 20 MG tablet Commonly known as:  CELEXA Take 1 tablet (20 mg total) by mouth daily.  Indication:  Depression   metFORMIN 500 MG tablet Commonly known as:  GLUCOPHAGE Take 500 mg by mouth 2 (two) times daily with a meal.  Indication:  Type 2 Diabetes   nicotine 21 mg/24hr patch Commonly known as:  NICODERM CQ - dosed in mg/24 hours Place 1 patch (21 mg total) onto the skin daily. Start taking on:  February 03, 2019  Indication:  Nicotine Addiction   omeprazole 20 MG capsule Commonly known as:  PRILOSEC Take 20 mg by mouth daily.  Indication:  Gastroesophageal Reflux Disease   potassium chloride 10 MEQ CR capsule Commonly known as:  MICRO-K Take 1 capsule by mouth daily.  Indication:  Low Amount of Potassium in the Blood   telmisartan 40 MG tablet Commonly known as:  MICARDIS Take 1 tablet (40 mg total) by mouth daily.  Indication:  High Blood Pressure Disorder   torsemide 20 MG tablet Commonly known as:  DEMADEX Take 1 tablet (20 mg) twice daily. Take an extra tablet (20 mg) in the morning on M,W,F.  Indication:  Edema      Follow-up Information    patient declines Follow up.    Contact information: patient declines outpatient follow up           Follow-up recommendations: Activity as tolerated. Diet as recommended by primary care physician. Keep all scheduled follow-up appointments as recommended.   Comments:   Patient is instructed to take all prescribed medications as recommended. Report any side effects or adverse reactions to your outpatient psychiatrist. Patient is instructed to abstain from alcohol and illegal drugs while on prescription medications. In the event of worsening symptoms, patient is instructed to call the crisis hotline, 911, or go to the nearest emergency department for evaluation and treatment.  Signed: Aldean BakerJanet E Kataleya Zaugg, NP 02/02/2019, 2:37 PM

## 2019-02-02 NOTE — Progress Notes (Signed)
Patient ID: Lori Carter, female   DOB: July 18, 1966, 53 y.o.   MRN: [id number]   Pt did not attend morning orientation group with the [facility]. Pt was informed group was taking place and chose not to come.

## 2019-02-02 NOTE — Progress Notes (Signed)
Discharge Note:  Patient discharged home with friend.  Patient denied SI and HI.   Denied A/V hallucinations.   Suicide prevention information given and discussed with patient who stated she understood and had no questions.  Patient stated she received all her belongings, clothing, toiletries, misc.  Patient stated she appreciated all assistance received from [facility] staff.  All required discharge information given to patient at discharge.

## 2019-03-03 DIAGNOSIS — F322 Major depressive disorder, single episode, severe without psychotic features: Secondary | ICD-10-CM | POA: Diagnosis not present

## 2019-03-03 DIAGNOSIS — E1142 Type 2 diabetes mellitus with diabetic polyneuropathy: Secondary | ICD-10-CM | POA: Diagnosis not present

## 2019-03-03 DIAGNOSIS — I10 Essential (primary) hypertension: Secondary | ICD-10-CM | POA: Diagnosis not present

## 2019-03-03 DIAGNOSIS — E782 Mixed hyperlipidemia: Secondary | ICD-10-CM | POA: Diagnosis not present

## 2019-03-07 DIAGNOSIS — E782 Mixed hyperlipidemia: Secondary | ICD-10-CM | POA: Diagnosis not present

## 2019-03-07 DIAGNOSIS — E1142 Type 2 diabetes mellitus with diabetic polyneuropathy: Secondary | ICD-10-CM | POA: Diagnosis not present

## 2019-03-29 DIAGNOSIS — N63 Unspecified lump in unspecified breast: Secondary | ICD-10-CM | POA: Diagnosis not present

## 2019-03-29 DIAGNOSIS — N644 Mastodynia: Secondary | ICD-10-CM | POA: Diagnosis not present

## 2019-03-30 DIAGNOSIS — N644 Mastodynia: Secondary | ICD-10-CM | POA: Diagnosis not present

## 2019-04-04 DIAGNOSIS — Z6841 Body Mass Index (BMI) 40.0 and over, adult: Secondary | ICD-10-CM | POA: Diagnosis not present

## 2019-04-04 DIAGNOSIS — I10 Essential (primary) hypertension: Secondary | ICD-10-CM | POA: Diagnosis not present

## 2019-04-04 DIAGNOSIS — Z1159 Encounter for screening for other viral diseases: Secondary | ICD-10-CM | POA: Diagnosis not present

## 2019-04-04 DIAGNOSIS — N644 Mastodynia: Secondary | ICD-10-CM | POA: Diagnosis not present

## 2019-04-12 DIAGNOSIS — R928 Other abnormal and inconclusive findings on diagnostic imaging of breast: Secondary | ICD-10-CM | POA: Diagnosis not present

## 2019-04-12 DIAGNOSIS — N6489 Other specified disorders of breast: Secondary | ICD-10-CM | POA: Diagnosis not present

## 2019-04-12 DIAGNOSIS — N644 Mastodynia: Secondary | ICD-10-CM | POA: Diagnosis not present

## 2019-04-27 DIAGNOSIS — J180 Bronchopneumonia, unspecified organism: Secondary | ICD-10-CM | POA: Diagnosis not present

## 2019-04-27 DIAGNOSIS — Z1159 Encounter for screening for other viral diseases: Secondary | ICD-10-CM | POA: Diagnosis not present

## 2019-05-19 DIAGNOSIS — E119 Type 2 diabetes mellitus without complications: Secondary | ICD-10-CM | POA: Diagnosis not present

## 2019-05-19 DIAGNOSIS — H524 Presbyopia: Secondary | ICD-10-CM | POA: Diagnosis not present

## 2019-05-19 DIAGNOSIS — Z7984 Long term (current) use of oral hypoglycemic drugs: Secondary | ICD-10-CM | POA: Diagnosis not present

## 2019-06-08 DIAGNOSIS — Z23 Encounter for immunization: Secondary | ICD-10-CM | POA: Diagnosis not present

## 2019-06-13 DIAGNOSIS — R05 Cough: Secondary | ICD-10-CM | POA: Diagnosis not present

## 2019-06-13 DIAGNOSIS — J180 Bronchopneumonia, unspecified organism: Secondary | ICD-10-CM | POA: Diagnosis not present

## 2019-06-13 DIAGNOSIS — Z1159 Encounter for screening for other viral diseases: Secondary | ICD-10-CM | POA: Diagnosis not present

## 2019-06-30 DIAGNOSIS — I5032 Chronic diastolic (congestive) heart failure: Secondary | ICD-10-CM | POA: Diagnosis not present

## 2019-06-30 DIAGNOSIS — G4733 Obstructive sleep apnea (adult) (pediatric): Secondary | ICD-10-CM | POA: Diagnosis not present

## 2019-06-30 DIAGNOSIS — J0100 Acute maxillary sinusitis, unspecified: Secondary | ICD-10-CM | POA: Diagnosis not present

## 2019-07-12 DIAGNOSIS — I10 Essential (primary) hypertension: Secondary | ICD-10-CM | POA: Diagnosis not present

## 2019-07-12 DIAGNOSIS — E1159 Type 2 diabetes mellitus with other circulatory complications: Secondary | ICD-10-CM | POA: Diagnosis not present

## 2019-07-12 DIAGNOSIS — E782 Mixed hyperlipidemia: Secondary | ICD-10-CM | POA: Diagnosis not present

## 2019-07-12 DIAGNOSIS — E1142 Type 2 diabetes mellitus with diabetic polyneuropathy: Secondary | ICD-10-CM | POA: Diagnosis not present

## 2019-07-19 DIAGNOSIS — I340 Nonrheumatic mitral (valve) insufficiency: Secondary | ICD-10-CM | POA: Diagnosis not present

## 2019-07-19 DIAGNOSIS — I10 Essential (primary) hypertension: Secondary | ICD-10-CM | POA: Diagnosis not present

## 2019-07-19 DIAGNOSIS — I361 Nonrheumatic tricuspid (valve) insufficiency: Secondary | ICD-10-CM | POA: Diagnosis not present

## 2019-07-21 DIAGNOSIS — Z111 Encounter for screening for respiratory tuberculosis: Secondary | ICD-10-CM | POA: Diagnosis not present

## 2019-07-24 DIAGNOSIS — Z111 Encounter for screening for respiratory tuberculosis: Secondary | ICD-10-CM | POA: Diagnosis not present

## 2019-08-09 DIAGNOSIS — G4733 Obstructive sleep apnea (adult) (pediatric): Secondary | ICD-10-CM | POA: Diagnosis not present

## 2019-09-07 ENCOUNTER — Other Ambulatory Visit: Payer: Self-pay | Admitting: Cardiology

## 2019-09-07 DIAGNOSIS — I5032 Chronic diastolic (congestive) heart failure: Secondary | ICD-10-CM

## 2019-09-07 DIAGNOSIS — I110 Hypertensive heart disease with heart failure: Secondary | ICD-10-CM

## 2019-09-07 MED ORDER — TELMISARTAN 40 MG PO TABS: 40.0000 mg | ORAL_TABLET | Freq: Every day | ORAL | 1 refills | Status: AC

## 2019-09-07 MED ORDER — TORSEMIDE 20 MG PO TABS: ORAL_TABLET | ORAL | 1 refills | Status: AC

## 2019-09-07 NOTE — Telephone Encounter (Signed)
Needs torsimide and tomasartin called to [facility] [place]

## 2019-09-28 NOTE — Progress Notes (Deleted)
Cardiology Office Note:    Date:  09/28/2019   ID:  Trinetta, Alemu 27-Mar-1966, MRN [id number]  PCP:  Abigail Miyamoto, MD  Cardiologist:  Norman Herrlich, MD    Referring MD: Abigail Miyamoto,*    ASSESSMENT:    1. Hypertensive heart disease with heart failure (HCC)   2. Nonrheumatic aortic valve insufficiency   3. Non-rheumatic mitral regurgitation   4. PVD (peripheral vascular disease) (HCC)   5. Mixed hyperlipidemia    PLAN:    In order of problems listed above:  1. ***   Next appointment: ***   Medication Adjustments/Labs and Tests Ordered: Current medicines are reviewed at length with the patient today.  Concerns regarding medicines are outlined above.  No orders of the defined types were placed in this encounter.  No orders of the defined types were placed in this encounter.   No chief complaint on file.   History of Present Illness:    Keegan Bensch is a 54 y.o. female with a hx of heart failure,mild to moderate AR and mild MR,  cardiomyopathy, hypertension hyperlipidemia type 2 diabetes peripheral arterial disease with aortobifemoral bypass surgery last seen 07/14/2018. Compliance with diet, lifestyle and medications: ***  Echo 03/22/2018: - Left ventricle: The cavity size was normal. Wall thickness was   increased in a pattern of moderate to severe LVH. There was   moderate concentric hypertrophy. Systolic function was normal.   The estimated ejection fraction was in the range of 60% to 65%.   Wall motion was normal; there were no regional wall motion   abnormalities. Doppler parameters are consistent with abnormal   left ventricular relaxation (grade 1 diastolic dysfunction). - Aortic valve: There was mild to moderate regurgitation.   Regurgitation pressure half-time: 423 ms. - Mitral valve: There was mild regurgitation. - Right ventricle: The cavity size was mildly dilated. Wall   thickness was normal. - Left  ventricle: The cavity size was normal. Wall thickness was   increased in a pattern of moderate to severe LVH. There was   moderate concentric hypertrophy. Systolic function was normal.   The estimated ejection fraction was in the range of 60% to 65%.   Wall motion was normal; there were no regional wall motion   abnormalities. Doppler parameters are consistent with abnormal   left ventricular relaxation (grade 1 diastolic dysfunction). - Aortic valve: There was mild to moderate regurgitation.   Regurgitation pressure half-time: 423 ms. - Mitral valve: There was mild regurgitation. - Right ventricle: The cavity size was mildly dilated. Wall   thickness was normal.  Past Medical History:  Diagnosis Date  . Acne   . Anemia 02/17/2018  . Anxiety   . Aortic occlusion (HCC) 02/08/2017  . Atopic neurodermatitis   . Cardiomegaly   . Cardiomyopathy (HCC) 04/23/2016  . Cellulitis of toe of left foot    acute  . Complication of anesthesia    woke up during surgery one  20 some years ago  . COPD (chronic obstructive pulmonary disease) (HCC)   . Diabetic neuropathy (HCC)    fingers  . Dyspnea    with exertion  . Dysthymic disorder   . Epidermal inclusion cyst   . Essential hypertension 04/23/2016  . Gastric mass   . Gastric ulcer 02/17/2018  . GERD (gastroesophageal reflux disease)    not taking anything  . Heart murmur    told years ago- , no one has said anything in years  . Hyperlipidemia   .  Hypertension, benign    chronic  . Obesity 04/23/2016  . Obstructive sleep apnea    uses cpap set on 13 uses 1/2 of the tiem   . OSA (obstructive sleep apnea) 02/17/2018  . Tobacco abuse 04/23/2016  . Type 2 diabetes mellitus without complication (HCC)    chronic    Past Surgical History:  Procedure Laterality Date  . AORTA - BILATERAL FEMORAL ARTERY BYPASS GRAFT N/A 02/08/2017   Procedure: AORTA BIFEMORAL BYPASS GRAFT;  Surgeon: Larina Earthly, MD;  Location: [facility] OR;  Service: Vascular;   Laterality: N/A;  . EUS N/A 09/09/2017   Procedure: UPPER ENDOSCOPIC ULTRASOUND (EUS) LINEAR;  Surgeon: Rachael Fee, MD;  Location: [facility] ENDOSCOPY;  Service: Endoscopy;  Laterality: N/A;  . none    . NOVASURE ABLATION    . SHOULDER SURGERY Left    rotator cuff  . TUBAL LIGATION      Current Medications: No outpatient medications have been marked as taking for the 09/29/19 encounter (Appointment) with Baldo Daub, MD.     Allergies:   Patient has no known allergies.   Social History   Socioeconomic History  . Marital status: Divorced    Spouse name: Not on file  . Number of children: Not on file  . Years of education: Not on file  . Highest education level: Not on file  Occupational History  . Occupation: CNA  Tobacco Use  . Smoking status: Light Tobacco Smoker    Packs/day: 0.33    Years: 29.00    Pack years: 9.57  . Smokeless tobacco: Never Used  . Tobacco comment: 3 cigarettes per day  Substance and Sexual Activity  . Alcohol use: No  . Drug use: No  . Sexual activity: Not Currently  Other Topics Concern  . Not on file  Social History Narrative  . Not on file   Social Determinants of Health   Financial Resource Strain:   . Difficulty of Paying Living Expenses: Not on file  Food Insecurity:   . Worried About Programme researcher, broadcasting/film/video in the Last Year: Not on file  . Ran Out of Food in the Last Year: Not on file  Transportation Needs:   . Lack of Transportation (Medical): Not on file  . Lack of Transportation (Non-Medical): Not on file  Physical Activity:   . Days of Exercise per Week: Not on file  . Minutes of Exercise per Session: Not on file  Stress:   . Feeling of Stress : Not on file  Social Connections:   . Frequency of Communication with Friends and Family: Not on file  . Frequency of Social Gatherings with Friends and Family: Not on file  . Attends Religious Services: Not on file  . Active Member of Clubs or Organizations: Not on file  . Attends  Banker Meetings: Not on file  . Marital Status: Not on file     Family History: The patient's ***family history includes Clotting disorder in her mother; Diabetes in her father; Heart murmur in her father; Hypertension in her father and mother; Pancreatic cancer in her father. ROS:   Please see the history of present illness.    All other systems reviewed and are negative.  EKGs/Labs/Other Studies Reviewed:    The following studies were reviewed today:  EKG:  EKG ordered today and personally reviewed.  The ekg ordered today demonstrates ***  Recent Labs: 01/30/2019: ALT 25; Hemoglobin 12.8; Platelets 200 02/01/2019: TSH 1.051 02/02/2019: BUN 21;  Creatinine, Ser 0.86; Potassium 3.8; Sodium 141  Recent Lipid Panel    Component Value Date/Time   CHOL 162 02/01/2019 0642   TRIG 124 02/01/2019 0642   HDL 32 (L) 02/01/2019 0642   CHOLHDL 5.1 02/01/2019 0642   VLDL 25 02/01/2019 0642   LDLCALC 105 (H) 02/01/2019 0160    Physical Exam:    VS:  There were no vitals taken for this visit.    Wt Readings from Last 3 Encounters:  01/30/19 245 lb (111.1 kg)  07/14/18 253 lb (114.8 kg)  05/16/18 250 lb 8 oz (113.6 kg)     GEN: *** Well nourished, well developed in no acute distress HEENT: Normal NECK: No JVD; No carotid bruits LYMPHATICS: No lymphadenopathy CARDIAC: ***RRR, no murmurs, rubs, gallops RESPIRATORY:  Clear to auscultation without rales, wheezing or rhonchi  ABDOMEN: Soft, non-tender, non-distended MUSCULOSKELETAL:  No edema; No deformity  SKIN: Warm and dry NEUROLOGIC:  Alert and oriented x 3 PSYCHIATRIC:  Normal affect    Signed, Shirlee More, MD  09/28/2019 8:01 PM    [facility]

## 2019-09-29 ENCOUNTER — Ambulatory Visit: Payer: BC Managed Care – PPO | Admitting: Cardiology

## 2019-12-06 ENCOUNTER — Ambulatory Visit: Payer: BC Managed Care – PPO | Admitting: Legal Medicine

## 2020-07-28 ENCOUNTER — Other Ambulatory Visit: Payer: Self-pay | Admitting: Legal Medicine

## 2020-07-28 DIAGNOSIS — I429 Cardiomyopathy, unspecified: Secondary | ICD-10-CM

## 2020-07-28 DIAGNOSIS — E1169 Type 2 diabetes mellitus with other specified complication: Secondary | ICD-10-CM

## 2020-07-28 DIAGNOSIS — I110 Hypertensive heart disease with heart failure: Secondary | ICD-10-CM

## 2020-07-28 NOTE — Telephone Encounter (Signed)
Needs a chronic visit lp

## 2021-03-25 DIAGNOSIS — I340 Nonrheumatic mitral (valve) insufficiency: Secondary | ICD-10-CM

## 2021-08-30 ENCOUNTER — Other Ambulatory Visit: Payer: Self-pay | Admitting: Legal Medicine

## 2021-08-30 DIAGNOSIS — I110 Hypertensive heart disease with heart failure: Secondary | ICD-10-CM

## 2021-10-02 ENCOUNTER — Other Ambulatory Visit: Payer: Self-pay | Admitting: Legal Medicine

## 2021-10-02 DIAGNOSIS — I429 Cardiomyopathy, unspecified: Secondary | ICD-10-CM

## 2022-07-05 ENCOUNTER — Other Ambulatory Visit: Payer: Self-pay | Admitting: Legal Medicine

## 2022-07-05 DIAGNOSIS — I110 Hypertensive heart disease with heart failure: Secondary | ICD-10-CM

## 2024-02-18 ENCOUNTER — Other Ambulatory Visit: Payer: Self-pay

## 2024-02-18 DIAGNOSIS — I429 Cardiomyopathy, unspecified: Secondary | ICD-10-CM
